# Patient Record
Sex: Female | Born: 1965 | Race: White | Hispanic: No | Smoking: Former smoker
Health system: Southern US, Community
[De-identification: ages and names within clinical notes are randomized; demographics above are authoritative.]

## PROBLEM LIST (undated history)

## (undated) DIAGNOSIS — M797 Fibromyalgia: Secondary | ICD-10-CM

## (undated) DIAGNOSIS — F4310 Post-traumatic stress disorder, unspecified: Secondary | ICD-10-CM

## (undated) DIAGNOSIS — F419 Anxiety disorder, unspecified: Secondary | ICD-10-CM

## (undated) DIAGNOSIS — F329 Major depressive disorder, single episode, unspecified: Secondary | ICD-10-CM

## (undated) DIAGNOSIS — F319 Bipolar disorder, unspecified: Secondary | ICD-10-CM

## (undated) DIAGNOSIS — K589 Irritable bowel syndrome without diarrhea: Secondary | ICD-10-CM

## (undated) DIAGNOSIS — F32A Depression, unspecified: Secondary | ICD-10-CM

## (undated) DIAGNOSIS — K219 Gastro-esophageal reflux disease without esophagitis: Secondary | ICD-10-CM

## (undated) HISTORY — PX: CHOLECYSTECTOMY: SHX55

## (undated) HISTORY — DX: Anxiety disorder, unspecified: F41.9

## (undated) HISTORY — DX: Bipolar disorder, unspecified: F31.9

---

## 2000-07-21 ENCOUNTER — Encounter: Payer: Self-pay | Admitting: Emergency Medicine

## 2000-07-21 ENCOUNTER — Emergency Department (HOSPITAL_COMMUNITY): Admission: EM | Admit: 2000-07-21 | Discharge: 2000-07-21 | Payer: Self-pay | Admitting: Emergency Medicine

## 2001-01-19 ENCOUNTER — Encounter: Admission: RE | Admit: 2001-01-19 | Discharge: 2001-02-17 | Payer: Self-pay | Admitting: Family Medicine

## 2002-10-16 ENCOUNTER — Ambulatory Visit (HOSPITAL_COMMUNITY): Admission: RE | Admit: 2002-10-16 | Discharge: 2002-10-16 | Payer: Self-pay | Admitting: Neurology

## 2002-10-16 ENCOUNTER — Encounter (INDEPENDENT_AMBULATORY_CARE_PROVIDER_SITE_OTHER): Payer: Self-pay | Admitting: *Deleted

## 2003-05-08 ENCOUNTER — Emergency Department (HOSPITAL_COMMUNITY): Admission: EM | Admit: 2003-05-08 | Discharge: 2003-05-08 | Payer: Self-pay | Admitting: *Deleted

## 2003-05-09 ENCOUNTER — Ambulatory Visit (HOSPITAL_COMMUNITY): Admission: RE | Admit: 2003-05-09 | Discharge: 2003-05-09 | Payer: Self-pay | Admitting: *Deleted

## 2003-05-09 ENCOUNTER — Encounter: Payer: Self-pay | Admitting: *Deleted

## 2003-05-14 ENCOUNTER — Encounter: Payer: Self-pay | Admitting: General Surgery

## 2003-05-14 ENCOUNTER — Ambulatory Visit (HOSPITAL_COMMUNITY): Admission: RE | Admit: 2003-05-14 | Discharge: 2003-05-14 | Payer: Self-pay | Admitting: General Surgery

## 2003-05-17 ENCOUNTER — Ambulatory Visit (HOSPITAL_COMMUNITY): Admission: RE | Admit: 2003-05-17 | Discharge: 2003-05-17 | Payer: Self-pay | Admitting: Gastroenterology

## 2003-05-20 ENCOUNTER — Encounter (INDEPENDENT_AMBULATORY_CARE_PROVIDER_SITE_OTHER): Payer: Self-pay | Admitting: *Deleted

## 2003-05-20 ENCOUNTER — Ambulatory Visit (HOSPITAL_COMMUNITY): Admission: RE | Admit: 2003-05-20 | Discharge: 2003-05-21 | Payer: Self-pay | Admitting: Surgery

## 2003-05-20 ENCOUNTER — Encounter: Payer: Self-pay | Admitting: Surgery

## 2004-07-01 ENCOUNTER — Other Ambulatory Visit: Admission: RE | Admit: 2004-07-01 | Discharge: 2004-07-01 | Payer: Self-pay | Admitting: Family Medicine

## 2004-08-13 ENCOUNTER — Encounter
Admission: RE | Admit: 2004-08-13 | Discharge: 2004-11-11 | Payer: Self-pay | Admitting: Physical Medicine and Rehabilitation

## 2004-08-17 ENCOUNTER — Ambulatory Visit: Payer: Self-pay | Admitting: Physical Medicine and Rehabilitation

## 2004-09-25 ENCOUNTER — Encounter: Admission: RE | Admit: 2004-09-25 | Discharge: 2004-09-25 | Payer: Self-pay | Admitting: Family Medicine

## 2004-10-16 ENCOUNTER — Ambulatory Visit: Payer: Self-pay | Admitting: Physical Medicine and Rehabilitation

## 2004-11-23 ENCOUNTER — Encounter
Admission: RE | Admit: 2004-11-23 | Discharge: 2005-02-21 | Payer: Self-pay | Admitting: Physical Medicine and Rehabilitation

## 2004-12-23 ENCOUNTER — Ambulatory Visit: Payer: Self-pay | Admitting: Physical Medicine and Rehabilitation

## 2004-12-29 ENCOUNTER — Ambulatory Visit: Payer: Self-pay | Admitting: Physical Medicine & Rehabilitation

## 2005-02-17 ENCOUNTER — Ambulatory Visit: Payer: Self-pay | Admitting: Physical Medicine and Rehabilitation

## 2005-03-16 ENCOUNTER — Encounter
Admission: RE | Admit: 2005-03-16 | Discharge: 2005-06-14 | Payer: Self-pay | Admitting: Physical Medicine and Rehabilitation

## 2005-04-16 ENCOUNTER — Ambulatory Visit: Payer: Self-pay | Admitting: Physical Medicine and Rehabilitation

## 2005-05-20 ENCOUNTER — Ambulatory Visit: Payer: Self-pay | Admitting: Physical Medicine and Rehabilitation

## 2005-07-23 ENCOUNTER — Other Ambulatory Visit: Admission: RE | Admit: 2005-07-23 | Discharge: 2005-07-23 | Payer: Self-pay | Admitting: Family Medicine

## 2005-11-23 ENCOUNTER — Encounter: Admission: RE | Admit: 2005-11-23 | Discharge: 2006-02-21 | Payer: Self-pay | Admitting: Family Medicine

## 2006-09-14 ENCOUNTER — Other Ambulatory Visit: Admission: RE | Admit: 2006-09-14 | Discharge: 2006-09-14 | Payer: Self-pay | Admitting: Family Medicine

## 2009-06-05 ENCOUNTER — Emergency Department (HOSPITAL_COMMUNITY): Admission: EM | Admit: 2009-06-05 | Discharge: 2009-06-05 | Payer: Self-pay | Admitting: Emergency Medicine

## 2009-07-01 ENCOUNTER — Emergency Department (HOSPITAL_COMMUNITY): Admission: EM | Admit: 2009-07-01 | Discharge: 2009-07-01 | Payer: Self-pay | Admitting: Emergency Medicine

## 2009-07-01 IMAGING — CR DG CHEST 2V
2 series · 2 of 2 positions shown · non-contrast
Comparison: None.

CLINICAL DATA: Short of breath.  Right-sided pain.

CHEST - 2 VIEW

[w chest pa]
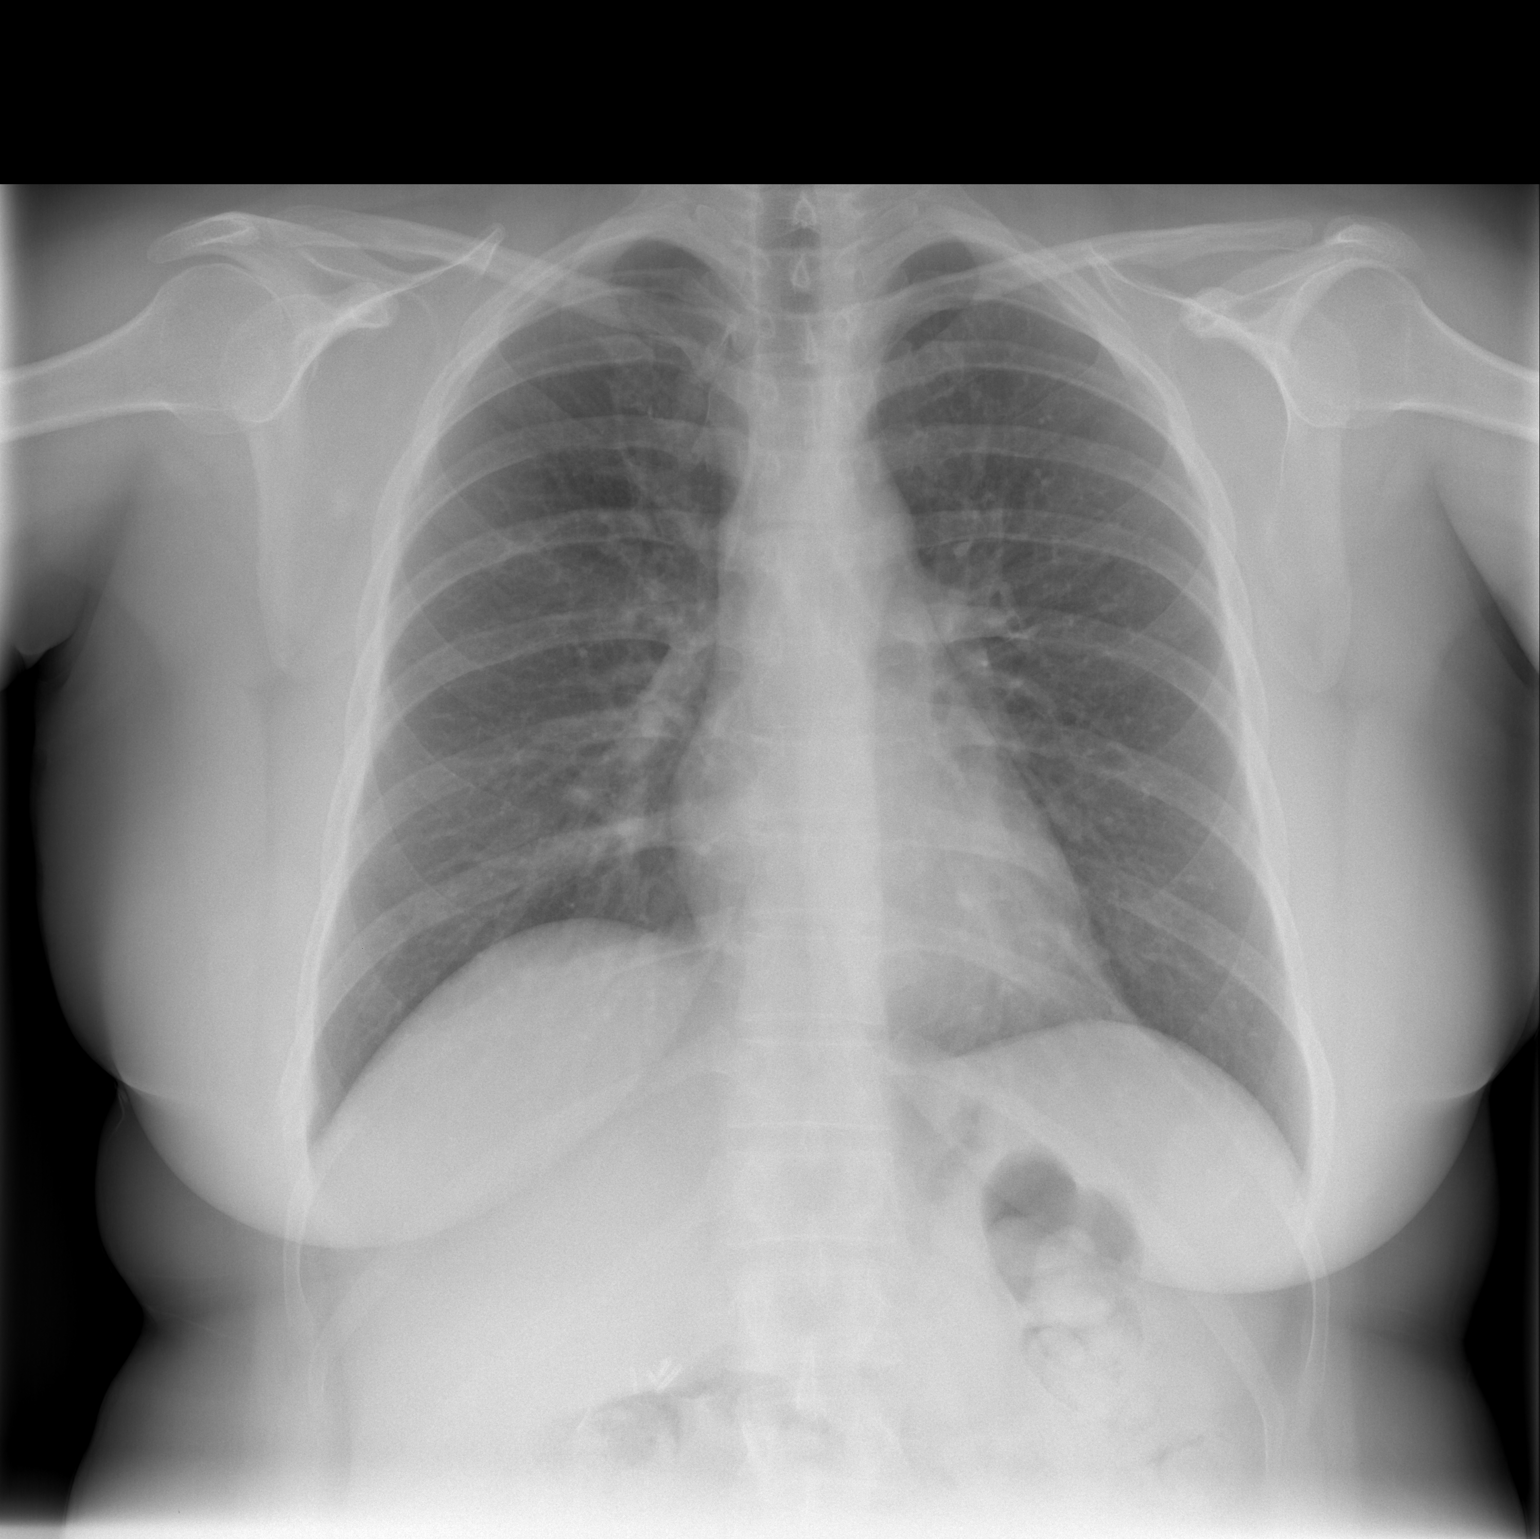

[w chest lat]
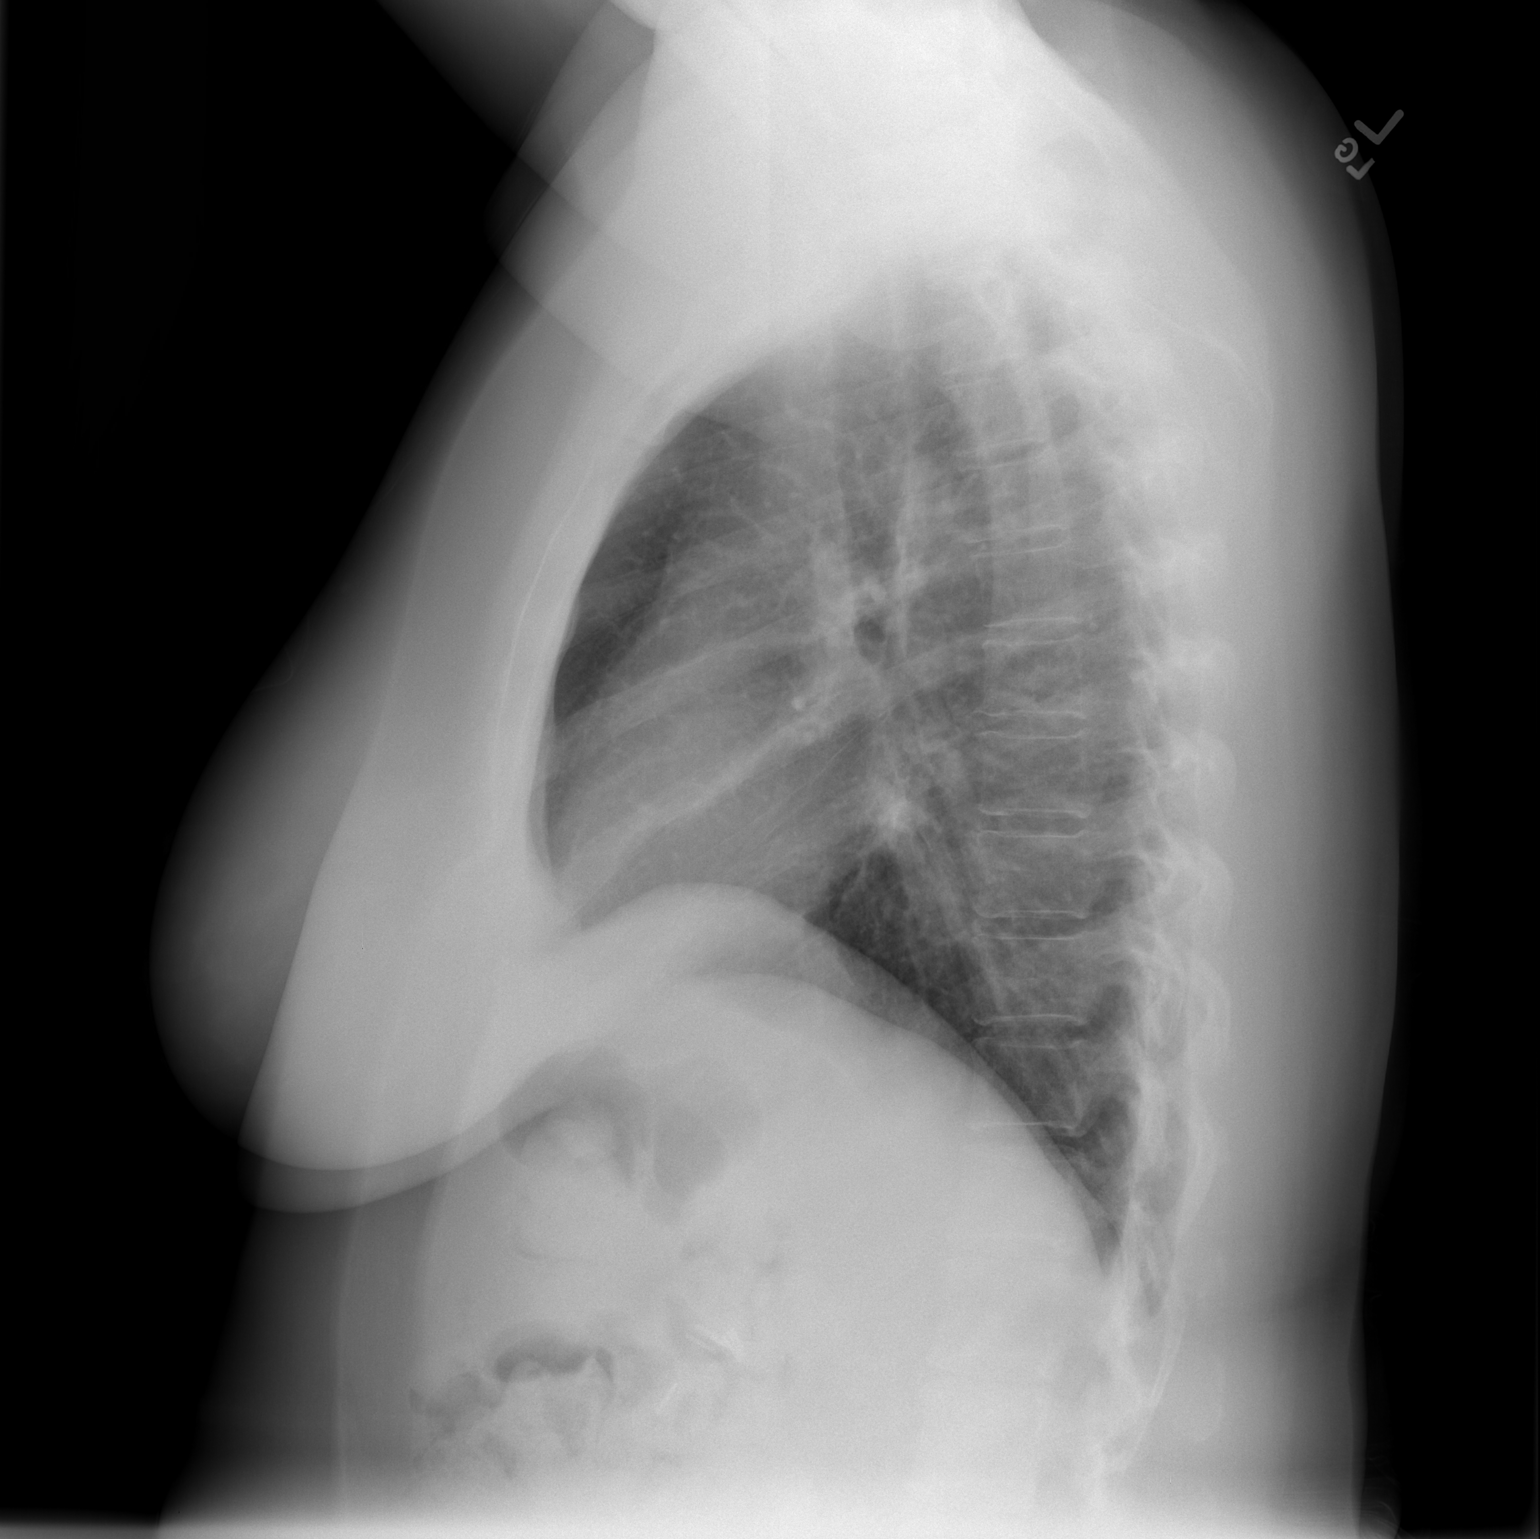

[2 of 2 positions shown; findings below may reference images not displayed]

FINDINGS: There is no airspace disease or effusion.
Cardiopericardial silhouette and mediastinal contours are normal.
Trachea is midline.  Paratracheal stripes appear within normal
limits.  Cholecystectomy clips are present. Cortical deformity is
present in the lateral right ninth rib that could potentially
represent fracture.  Clinically correlate for site of tenderness.
No pneumothorax.
IMPRESSION: No acute cardiopulmonary disease.  Possible right lateral ninth rib
fracture.

## 2010-12-05 ENCOUNTER — Encounter: Payer: Self-pay | Admitting: Physical Medicine and Rehabilitation

## 2010-12-06 ENCOUNTER — Encounter: Payer: Self-pay | Admitting: Family Medicine

## 2011-01-08 ENCOUNTER — Inpatient Hospital Stay (INDEPENDENT_AMBULATORY_CARE_PROVIDER_SITE_OTHER)
Admission: RE | Admit: 2011-01-08 | Discharge: 2011-01-08 | Disposition: A | Discharge status: Home / Self Care | Payer: Self-pay | Source: Ambulatory Visit | Attending: Family Medicine | Admitting: Family Medicine

## 2011-01-08 DIAGNOSIS — M549 Dorsalgia, unspecified: Secondary | ICD-10-CM

## 2011-02-21 LAB — POCT URINALYSIS DIP (DEVICE)
Bilirubin Urine: NEGATIVE
Hgb urine dipstick: NEGATIVE
Nitrite: NEGATIVE
Specific Gravity, Urine: 1.015 (ref 1.005–1.030)
pH: 7.5 (ref 5.0–8.0)

## 2011-02-21 LAB — CBC
HCT: 42.4 % (ref 36.0–46.0)
Hemoglobin: 15 g/dL (ref 12.0–15.0)
MCHC: 35.3 g/dL (ref 30.0–36.0)
MCV: 95.8 fL (ref 78.0–100.0)
Platelets: 291 10*3/uL (ref 150–400)
RBC: 4.43 MIL/uL (ref 3.87–5.11)
RDW: 12.1 % (ref 11.5–15.5)
WBC: 12.5 10*3/uL — ABNORMAL HIGH (ref 4.0–10.5)

## 2011-02-21 LAB — DIFFERENTIAL
Lymphocytes Relative: 26 % (ref 12–46)
Lymphs Abs: 3.2 10*3/uL (ref 0.7–4.0)
Monocytes Relative: 7 % (ref 3–12)
Neutro Abs: 8.3 10*3/uL — ABNORMAL HIGH (ref 1.7–7.7)
Neutrophils Relative %: 66 % (ref 43–77)

## 2011-02-21 LAB — COMPREHENSIVE METABOLIC PANEL
ALT: 19 U/L (ref 0–35)
BUN: 8 mg/dL (ref 6–23)
Calcium: 9.4 mg/dL (ref 8.4–10.5)
Creatinine, Ser: 0.86 mg/dL (ref 0.4–1.2)
GFR calc non Af Amer: >60 mL/min (ref >60)
Glucose, Bld: 92 mg/dL (ref 70–99)
Sodium: 138 mEq/L (ref 135–145)
Total Protein: 7.1 g/dL (ref 6.0–8.3)

## 2011-04-02 NOTE — Assessment & Plan Note (Signed)
REFERRING PHYSICIAN:  Shaune Pollack, M.D.   HISTORY:  [name] is a 45 year old white female who has a history of lower  extremity spasticity who also has complaints of mid to upper thoracic back  pain which she describes as a sharp and stabbing sensation and  intermittently tingling and aching however, fairly constant pain.  Her  average pain is about a 6 on a scale of 10.  Her sleep is overall fair.  She  gets fair relief from her medications.  Pain is worse with walking, bending,  sitting, and standing; improves with rest.   FUNCTION:  Patient uses a cane for ambulating, can walk about 30 minutes at  a time, is able to climb stairs and she does drive.  She has been disabled  since March 2005.  She requires some assistance with some household duties  and shopping.  Review of systems is reviewed on the health and history form  today.  She reports weight gain.  Denies any new changes in her past  medical, social or family history.   EXAMINATION:  Blood pressure 140/75, pulse 99, respirations 20, 100%  saturated on room air.  She is alert, oriented, cooperative.  Affect is  overall bright and alert.  She is able to stand up in the room and her gait  displayed some spasticity, especially the right lower extremity.  Seated  reflexes are overall brisk.  She has three beats of clonus on the left.  Motor strength is otherwise 5/5 at hip flexors, knee extensors,  dorsiflexors, plantar flexors, EHL.  She has somewhat ratchety manual muscle  testing, however.  She reports decreased sensation with pinprick throughout  the bilateral T5-6 dermatome.   IMPRESSION:  1.  Chronic upper thoracic back pain.  2.  Chronic neck pain.  3.  Lower extremity spasticity with three beats of clonus on the left.  4.  Patient reports diminished sensation in the T5-6 dermatome posteriorly.   PLAN:  We will have her continue to use baclofen.  She reports that it is  somewhat helpful with the spasticity and it is  somewhat sedating for her.  She does not take it when she is going to be driving.  She takes between  three and four baclofen pills a day.  She continues to use Lidoderm although  does not need a prescription today.  She has not had any further tremors in  her upper extremities.  With her decreased sensation to pinprick in the T6-7  dermatome and increased pain in that area in conjunction with the lower  extremity spasticity, I will go ahead and order thoracic MRI.  She has not  had thoracic MRI during the workup of her spasticity yet.  We will see her  back after these are completed.  Apparently she is also set up to see a  neurologist for evaluation as well.  We will see her back in 1 month.       DMK/MedQ  D:  11/25/2004 17:08:42  T:  11/25/2004 21:04:11  Job #:  [id number]

## 2011-04-02 NOTE — Op Note (Signed)
NAME:  Gwendolyn Green, Gwendolyn Green                          ACCOUNT NO.:  [id number]   MEDICAL RECORD NO.:  [id number]                   PATIENT TYPE:  AMB   LOCATION:  ENDO                                 FACILITY:  [facility]   PHYSICIAN:  Sandria Bales. Ezzard Standing, M.D.               DATE OF BIRTH:  1966/10/29   DATE OF PROCEDURE:  05/20/2003  DATE OF DISCHARGE:  05/17/2003                                 OPERATIVE REPORT   PREOPERATIVE DIAGNOSIS:  Biliary dyskinesia.   POSTOPERATIVE DIAGNOSIS:  Biliary dyskinesia.   PROCEDURE:  Laparoscopic cholecystectomy with intraoperative cholangiogram.   SURGEON:  Sandria Bales. Ezzard Standing, M.D.   FIRST ASSISTANT:  Velora Heckler, M.D.   ANESTHESIA:  General endotracheal.   ESTIMATED BLOOD LOSS:  Minimal.   INDICATIONS FOR PROCEDURE:  [name] is a 45 year old black female, patient  of Shaune Pollack, who has had epigastric and right upper quadrant abdominal  pain with nausea and some vomiting for approximately 2 weeks.  Her abdominal  pain has gotten better.  She has continued to feel nauseous.  Her symptoms  actually go back further than this, but these are more acute.  She underwent  ultrasound of her gallbladder, which was normal.  She had a hepatobiliary  scan, which showed a decreased ejection fraction with cholecystokinin of  26%.  She saw Dr. Charna Elizabeth, who did an upper endoscopy and found no  identified gastric duodenal lesion.  I discussed with the patient about  proceeding with laparoscopic cholecystectomy for biliary dyskinesia.   The indications and potential complications were explained to the patient.  The potential complications including, but not limited to, are bleeding,  infection, bowel injury, bile duct injury, open surgery, and the possibility  that this may not cure or take care of her symptoms.   She understands all of these risks today and comes to the operating room.   The patient is given a general intravenous anesthetic.  She is given 1 gm  of  Ancef for __________ .  She had PA stockings in place.  Her abdomen was  prepped with Betadine solution and sterilely draped.   Four trocars were placed in her abdomen, one at the umbilicus, with a Hasson  trocar secured with a 0 Vicryl suture and a 10 mm Ethicon trocar in the  subxiphoid location and two 5 mm trocars in the right subcostal location.   Abdominal exploration was carried out.  The right and left lobes of the  liver were unremarkable.  The anterior wall of the stomach was unremarkable.  The colon and pelvic organs were unremarkable.  I actually got a pretty good  look inside her entire abdominal cavity, so no nodularity, no mass, no  scarring, no other adhesions.  I did take pictures and placed these in the  chart.   I then turned my attention to the gallbladder.  The gallbladder was grasped  and rotated cephalad.  Dissection was carried out at the cystic  duct/gallbladder junction.  I identified the cystic artery which was triply  endoclipped.  I identified the cystic duct and placed a clip on the  gallbladder side of the cystic duct.   I then shot an intraoperative cholangiogram.  Intraoperative cholangiogram  was shot using a cut-off taut catheter inserted through a Jelco catheter  into the abdominal cavity.  I made an incision inside the cystic duct and  inserted the taut catheter and clipped this with an endo clip.  I used about  6 mL of half-strength Hypaque solution, showing free flow of contrast down  the cystic duct into the common bile duct and into the duodenum, and it  refluxed up the hepatic radical.  This was a normal intraoperative  cholangiogram.  There was no filling defect or mass.  I then removed the  taut catheter.  I triply endoclipped the cystic duct and divided the cystic  duct.  I then sharply and bluntly dissected the gallbladder from the  gallbladder bed.  There were an additional few vessels that I took with a  clip applier.   Prior to  completing the division of the gallbladder from the gallbladder  bed, I revisualized the triangle of Calot and revisualized the gallbladder  bed.  There was no bleeding or bile leak.  The gallbladder was then divided  from the liver and delivered through the umbilicus and sent to pathology.  The abdomen was then irrigated with about 500 mL of saline.  The trocar was  removed and turned.  The umbilical trocar closed with a 0 Vicryl suture.  There was no bleeding at any trocar site.  There was a Blake applied with a  Vicryl suture in the skin and closed this and then placed tincture of  Benzoin and Steri-Strips over the wounds and then sterilely dressed them.   The patient tolerated the procedure well and was transported to the recovery  room in good condition.  Sponge and needle count were correct at the end of  the operation.                                               Sandria Bales. Ezzard Standing, M.D.    DHN/MEDQ  D:  05/20/2003  T:  05/20/2003  Job:  [id number]   cc:   Duncan Dull, M.D.  [address]  [place]  [place] [address]  Fax: [phone]   Anselmo Rod, M.D.  [address]  [address]  [place]  [place] [address]  Fax: [phone]

## 2011-04-02 NOTE — Assessment & Plan Note (Signed)
MEDICAL RECORD NUMBER:  [id number].   [name] is a 45 year old single white female who is being seen in our pain  and rehabilitative clinic for multiple pain complaints. Today, she has  shaded her entire pain diagram in from the inferior half of the head through  the bilateral upper extremities, thoracic area, lumbar area, and posterior  down bilateral lower extremities to the feet. Anteriorly, she has complaints  of bilateral shoulder elbow, wrist pain, hip pain, bilateral knee pain, and  ankle pain. She describes her pain as being about an 8 to a 9 on a scale of  10. It varies between being intermittent and constant. Sharp, dull,  stabbing, and aching in its nature. Interferes with her general activity a  lot to completely. Time of day which is worse for her is morning, day time  and evening. Sleep is fair. Pain is typically worsened with activities,  walking, bending, sitting including inactivity, standing. Improves with  rest. She gets fair relief with her medications currently. She uses a cane  for mobility. She is able to climb steps and drive. She was last employed  January 30, 2004.   Admits to some weakness, tremor, trouble walking, spasms. Feels nausea is  related to high levels of pain. Denies any problems in her past medical,  social, or family history.   Medications at this time including Cymbalta 60 mg 1 p.o. q.d., Zonegran 300  mg q.h.s., baclofen 10 mg b.i.d.   PHYSICAL EXAMINATION:  She is a well-developed, mildly obese, white female  in no apparent distress. She is oriented x3. Affect is bright, alert,  cooperative, pleasant. She has a tremor in the right lower extremity as she  walks. Seated, reflexes are brisk in the upper and lower extremities with  one to two beats of clonus on the right. Motor strength is quite good  throughout. No sensory deficits reported today with light touch. No  significant tenderness to palpation in the parascapular region.   IMPRESSION:   Upper and lower extremity pain including neck, upper back, arms  and legs. May have a neuropathic component, possibly myofascial, and she  also complains of large joint involvement. Etiology not clear. She reports  she has some improvement with baclofen and Zonegran. Her dose of baclofen  was decreased to 10 mg once a day. She complained that her pain was somewhat  worse over the last month and is asking for baclofen twice a day today.   Neurologically, she appears stable. She apparently has had workup for  rheumatologic problems. She is also followed by neurologist. At this point,  I really do not have much more to offer her. We have suggested botulinum  toxin into some of more spastic lower extremity muscles to see if we could  improve her gait somewhat. Apparently, there is a problem with the insurance  covering this. Also recommended pool therapy to maintain strength and range  of motion. She has mentioned that she has problems with finances and may be  unable to afford the [facility] pool program. We will give her a prescription for  her baclofen 10 mg 1 p.o. b.i.d. with three refills. We will see her back in  three to six months.       DMK/MedQ  D:  05/21/2005 13:43:16  T:  05/21/2005 14:55:30  Job #:  [id number]

## 2011-04-02 NOTE — Assessment & Plan Note (Signed)
Gwendolyn Green is a 45 year old single white female who is being seen in our pain  and rehabilitative clinic for multiple pain complaints, including  cervicalgia, bilateral elbow pain, hand pain, lumbago, and bilateral leg  pain.  She also has a history of essential tremor, predominantly right lower  extremity and mildly in the right upper extremity.  She reports her average  pain throughout all of these areas is about 7/10.  She describes her pain as  being intermittently sharp, burning, stabbing, aching.  It is fairly  constant.  The pain is worse with walking, standing, sitting, and with  activity.  It improves somewhat with medications.  Currently, she is getting  a little relief with her medications.  She uses a cane to walk.  She is able  to walk 30-45 minutes at a time.  She is able to fine stairs but is having  some difficulty.  She currently drives.  She is disabled as of January 29, 2005.  She denies any problems with depression, anxiety, or suicidal  ideations.   REVIEW OF SYSTEMS:  Negative for everything except for weight loss.   PAST MEDICAL HISTORY:  Unchanged since last visit.   FAMILY HISTORY:  Unchanged since last visit.   SOCIAL HISTORY:  Unchanged since last visit.   PHYSICAL EXAMINATION:  VITAL SIGNS:  Blood pressure 123/78, pulse 130,  respirations 16, saturation 99% on room air.  GENERAL:  She is a well-developed, well-nourished woman in no apparent  distress.  Oriented x3.  Affect is overall bright and alert.  Appropriate.  NEUROMUSCULAR:  Gait is abnormal in that she has prominent tremor in the  right lower extremity, as she ambulates.  As she is sitting, it is noted  that she has a milder right upper extremity tremor.  She is able to flex  forward, extend back.  With her lumbar spine, she has some trouble with the  balance because the leg has a significant tremor during these activities.  She has normal cervical range of motion.  Normal shoulder range of motion.  She is quite flexible overall.  Lower extremity flexibility is also quite  good.  Motor strength is excellent throughout.  No obvious focal weaknesses  noted.  There is no numbness noted with leg test.   IMPRESSION:  1.  Myofascial parascapular cervical pain.  2.  Lower extremity spasms, most likely central in origin.  Unclear is the      etiology of the upper extremity discomfort she is experiencing in the      hands.  This is new since our last couple of visits.  May consider some      electrodiagnostic testing at some point.  She does have quite a bit of      financial issues as well as problems with insurance coverage.  In fact,      she is waiting for insurance approval to undergo the Botox injections.   Today we will refill her medication.  She felt that the Baclofen was  somewhat helpful with her pain.  We will put her back on 10 mg 1 p.o.  b.i.d., give her #60, and would do a trial of Neurontin 300 mg 1 p.o. q.h.s.  x5 days, 1 p.o. b.i.d. x5 days, and then 1 p.o. t.i.d., #90 given.  Again,  may consider electrodiagnostic studies.  Recommend dental strengthening  program.  The prescription for therapy was written out for her today.  She  might be quite well in  a pool environment.  If she tolerates the Neurontin  and it seems to be helpful with her pain, may consider increasing it at the  next visit.  May also consider adding Ultracet at the next visit as well.  We will see her back in a month.     DMK/MedQ  D:  02/17/2005 16:41:50  T:  02/17/2005 17:40:10  Job #:  [id number]

## 2011-04-02 NOTE — Assessment & Plan Note (Signed)
MEDICAL RECORD NUMBER:  [id number].   [name] is a 45 year old white female who is being seen in our pain and  rehabilitative clinic for pain in her upper back and upper extremities as  well as bilateral lower extremities. Etiology of this pain is not entirely  clear to me. She may carry a diagnosis of some fibromyalgia or parascapular  pain. She also has a tremor in her right upper extremity and predominantly  right lower extremity which may also exacerbate her pain.   She is being followed by neurology, Dr. Despina Arias at [facility]. She has an appointment with him scheduled in August  of this year.   She had been placed on Neurontin. She does not feel it has been particularly  helpful. She believes the Zonegran has been a little bit helpful for  treatment of her pain. Cymbalta is apparently somewhat successful with her  depression. Baclofen is also helpful for her in the evening for her spasms.   She reports I am sick today. They put a bug bomb in my new apartment. It  has made me nauseated and ill. She followed up with her PCP and has been  getting some Phenergan to help with her nausea. Indeed in the room today, we  had to stop her interval and her physical exam due to her nausea.   Her average pain located in the bilateral upper extremities and neck to  approximately the mid forearm area as well as bilateral lower extremities  throughout both legs. She describes it as intermittent pain, variably dull.  Average pain is about a 6 on a scale of 10, can go up to a 10. Pain is worse  with all activity. Improves with rest and medications. She gets fair relief  with her medications. Interferes quite a bit with activity overall. She  reports she is able to walk, however, about 45 minutes. She walks without  assistance occasionally with a cane. She is able to climb stairs and drive.  She is independent with all of all of her self-care including meal prep,  household duties, shopping.   REVIEW OF SYSTEMS:  Positive for tremors, tingling, spasms, dizziness. Also  nausea, diarrhea, poor appetite recently; currently being followed by PCP  for that.   No other change in past medical history other than reported in above note.  She has recently moved to a new apartment. No changes in family history  since last visit.   PHYSICAL EXAMINATION:  Blood pressure 153/85, pulse 100, respirations 18,  97% saturated on room air. Mildly obese white female who appears rather sick  today. She is oriented x3. She is cooperative and pleasant, however. She is  able to stand. She continues to have the prominent tremor in the right lower  extremity, especially with walking and weight bearing. She also has a tremor  in the right upper extremity when she holds her arm straight out, fine  tremor. Her motor strength otherwise is good throughout. Reflexes are brisk,  upper and lower extremities.   IMPRESSION:  Upper and lower extremity pain including neck and upper back  pain, etiology not entirely clear. Question central in origin as Zonegran  seems to help somewhat, possibly neuropathic in origin, possibly myofascial  component.   PLAN:  Will go ahead and refill her baclofen for at night to help with  spasms. Recommend a hydrotherapy program. She tells me she can afford it. We  will provide her some information on  the local [employer]. We will see her back in  a month. She brings in a disability form that needs to be filled out from  [facility].      DMK/MedQ  D:  04/16/2005 12:35:48  T:  04/17/2005 13:44:55  Job #:  [id number]

## 2011-04-02 NOTE — Op Note (Signed)
NAME:  Gwendolyn Green, Gwendolyn Green                          ACCOUNT NO.:  [id number]   MEDICAL RECORD NO.:  [id number]                   PATIENT TYPE:  AMB   LOCATION:  ENDO                                 FACILITY:  [facility]   PHYSICIAN:  Anselmo Rod, M.D.               DATE OF BIRTH:  Feb 04, 1966   DATE OF PROCEDURE:  05/17/2003  DATE OF DISCHARGE:                                 OPERATIVE REPORT   PROCEDURE PERFORMED:  Esophagogastroduodenoscopy.   ENDOSCOPIST:  Anselmo Rod, M.D.   INSTRUMENT USED:  Olympus video panendoscope.   INDICATION FOR PROCEDURE:  A 45 year old white female with a history of  biliary dyskinesia on a HIDA scan with an EF of 26%, undergoing an EGD to  rule out peptic ulcer disease.  The patient has ongoing right upper quadrant  epigastric pain and a history of abnormal weight loss.   PREPROCEDURE PREPARATION:  Informed consent was procured from the patient.  The patient was fasted for eight hours prior to the procedure.  Basic labs  including a TSH were drawn.   PREPROCEDURE PHYSICAL:  VITAL SIGNS:  The patient had stable vital signs.  NECK:  Supple.  CHEST:  Clear to auscultation.  S1, S2 regular.  ABDOMEN:  Soft with severe epigastric tenderness on palpation with guarding.  No rebound or rigidity, no hepatosplenomegaly.   DESCRIPTION OF PROCEDURE:  The patient was placed in the left lateral  decubitus position.  She chose not to be sedated for the procedure.  Once  the patient was adequately positioned, the Olympus video panendoscope was  advanced through the mouthpiece, over the tongue, into the esophagus, under  direct vision.  The entire esophagus appeared normal with no evidence of  ring, stricture, masses, esophagitis, or Barrett's mucosa.  The scope was  then advanced into the stomach.  There was moderate diffuse gastritis  throughout the gastric mucosa.  No ulcers, erosions, masses, or polyps were  seen.  Retroflexion in the high cardia revealed no  abnormalities.  The  duodenal bulb and the proximal small bowel distal to the bulb up to 60 cm  appeared normal.   IMPRESSION:  Normal esophagogastroduodenoscopy except for mild diffuse  gastritis.    RECOMMENDATIONS:  1. Proceed with laparoscopic cholecystectomy as recommended by Sandria Bales.     Ezzard Standing, M.D.  2. Follow up on basic labs including abnormal weight loss once the procedure     has been done.                                               Anselmo Rod, M.D.    JNM/MEDQ  D:  05/17/2003  T:  05/18/2003  Job:  [id number]   cc:   Sandria Bales. Ezzard Standing, M.D.  [address]  [address], [address]  [place]  [place] [address]  Fax: [phone]   Duncan Dull, M.D.  [address]  [place]  [place] [address]  Fax: [phone]

## 2011-04-02 NOTE — Consult Note (Signed)
DATE OF SERVICE:  December 29, 2004.   MEDICAL RECORD NUMBER:  [id number].   DATE OF BIRTH:  1966-01-06.   The patient is a 45 year old female with a history of increased right lower  extremity spasticity, who has undergo neurological evaluation, as well as  magnetic resonant imaging of her cervical spine and thoracic spine.  MRI was  a negative study dated November 30, 2004.  Thus far no definite reason for  her lower extremity spasticity has been identified.  C-spine MRI was  negative as well.  She notices her foot and leg shake with prolonged  standing.  She also has this with walking.  She has tried Baclofen 10 mg  q.i.d. for this with some relief.  Last neurologic evaluation on November 30, 2004, indicated episodic essential tremor.  Started on some Zonegran for  pain.   PAST MEDICAL HISTORY:  Significant for bilateral trochanteric bursitis.   FUNCTIONAL HISTORY:  She is independent with all of her self-care.  She  walks about 30 minutes at a time.  She does dishes, cleaning and laundry  around the house.   REVIEW OF SYSTEMS:  Depression improved after medications.  Poor sleep.  Spasms in the right leg.   PHYSICAL EXAMINATION:  Blood pressure 137/68, pulse 105, respiratory rate  18, O2 saturation 99% on room air.   The right lower extremity has mild tenderness to palpation in the  quadriceps.  There is no evidence of swelling in the quadriceps.  No  evidence of fasciculations.  She does have 3+ knee jerk on the right  compared to 2+ on the left and 1+ bilateral ankles.  No evidence of clonus  at the ankles.  She has no evidence of spasticity with passive range of  motion in bilateral lower extremities.  With standing, she does have a rapid  buckling and hyperextension of his right knee.  With keeping her foot flat,  her heel does come off with this movement and she also occasionally has hip  flexor activation, which takes her foot off of the ground.  Shifting her  weight to the left side tends to relieve these episodes.  When she walks,  she similarly has some knee hyperextension with some plantar flexion at the  ankle.  I do not see any dystonic movements on the left side or in the upper  extremities.  Her upper extremity strength is good.  She has no evidence of  increased tone with passive range of motion in the uppers either.   IMPRESSION:  Primarily right lower extremity muscle spasms.  This would not  be described as true spasticity given that it is not velocity related.  It  appears to be more of a dystonia.  Treatment-wise, I think the primary  muscle complex that is implicated in this movement pattern would be the  quadriceps.  I think it would be reasonable to give a trial of botulinum  toxin 200 units spread between the heads of the quadriceps.  I would think  that this would likely represent an underdosing.  However, given the risk  for increased quadriceps weakness, I think this would be the best way to  proceed.  I did indicate that she may need a brace, such as a KAFO should  she have excessive weakening of her quadriceps.  Would consider increasing  her Baclofen if she tolerates side effects.  Zanaflex may be a good choice  for her also given its pain relieving characteristics.  Some of the atypical  anticonvulsants can be pushed as well for dystonia-type management.   I will see the patient back for the botulinum injection.  I have noted that  this could make her walking worse, at least temporarily and she does  understand.  I also went over the risks for bleeding, bruising and  infections.  I will see her back after we get some insurance approval for  the injections.      AEK/MedQ  D:12/29/2004 15:21:39  T:12/29/2004 15:56:24  Job #:  [id number]

## 2011-04-02 NOTE — Group Therapy Note (Signed)
MEDICAL RECORD NUMBER:  [id number].   [name] is a 45 year old, single, white female who was referred by Duncan Dull, M.D., for evaluation and treatment of chronic pain which she has  experienced for quite a while now.   She has a history of an intention tremor.  She has seen approximately three  neurologists and has had brain MRI and EMG, all of which were apparently  normal.  I do not have all the notes regarding this.  She has also seen a  rheumatologist and had heavy metal screens done.   She has been treated with Ultram, Vicodin, Flexeril, Paxil, Elavil and TENS  unit in the past.  She has also had some physical therapy apparently.   She reports her main pain is located in the left side of her neck and  shoulder area.  This was a constant aching, burning-type feeling.  She also  has upper back and middle back pain, which she also describes as burning and  somewhat stabbing.  She has bilateral leg pain, however, worse on the right  than on the left, in the thighs, hips and calf area.  She has bilateral hand  stiffness as well, which she attributes possibly to her beginning knitting.   FUNCTIONAL STATUS:  The patient is independent with her activities of daily  living, including feeding, dressing, bathing, oral and facial hygiene and  meal prep.  She is able to walk about 30 minutes at a time.  She is able to  drive.  She is able to go up and down stairs, but feels unstable.   Denies any suicidal ideation.  Does admit to a history of depression.   SOCIAL HISTORY:  The patient denies alcohol use and reports that she has  stopped smoking.  Denies illicit drug use.  Denies any kind of DUI.  She  lives alone.  She last worked on January 30, 2004.  She worked as a Engineer, materials.  She is currently on disability.   FAMILY HISTORY:  Mother alive at age 35 with hypertension.  Father is age 51  with osteoarthritis.  No siblings.   ALLERGIES:  Denies any drug allergies.   REVIEW OF SYSTEMS:   Remarkable for spasms, depression, poor sleep and recent  weight gain.   PAST MEDICAL HISTORY:  1.  Depression.  2.  History of episodic tremor of unknown etiology.  3.  Hypercholesterolemia.   PAST SURGICAL HISTORY:  Cholecystectomy in July of 2004.   MEDICATIONS:  1.  Paxil 20 mg one p.o. daily.  2.  Elavil 25 mg two to three p.o. q.h.s.   PHYSICAL EXAMINATION:  GENERAL APPEARANCE:  A alert and oriented woman in no  apparent distress during our interview.  She was appropriate.  Did not  appear uncomfortable.  Cooperative.  VITAL SIGNS:  The blood pressure is 125/78, pulse 116, respirations 20 and  100% saturated on room air.  SKIN:  Unremarkable.  NECK:  Mild limitations with rotation to the right.  Otherwise full shoulder  range of motion.  EXTREMITIES:  Increased tone, especially in the lower extremities.  No edema  noted.  She had full range of motion in the hips, knees and ankles.  NEUROLOGIC:  No significant tenderness in the cervical paraspinal  musculature or parascapular musculature, just mild tenderness.  She had  motor strength in the upper extremities with 5/5 at shoulder abductors,  biceps, triceps, wrist extensors, finger flexors and intrinsics and 5/5 at  the hip flexors,  knee extensors, dorsiflexors, plantar flexors and EHL.  Significant tremor, more so on the right than on the left.  A mild tremor  was noted in the right upper extremity, more so on the left.  Reflexes were  2-3+ at the biceps, triceps and brachial radialis and 3+ at the patellar  tendon and Achilles' tendon.  Clonus was noted bilaterally.   On testing light touch and pinprick, she did not report any specific  deficits.  On Romberg's test, she had difficulty standing and appeared  somewhat unstable during Romberg's test.  Gait was quite remarkable for  quite a bit of spasticity during her gait cycle, especially when she beared  weight on the right lower extremity.  She had definite tremor with  each  step.  Her gait was mildly unstable.  She did prefer to use a cane when she  is out in the community.   IMPRESSION:  1.  Left shoulder and neck pain, as well as intrascapular pain.  2.  Bilateral lower extremity pain.  3.  Lower extremity increased tone and spasticity.   PLAN:  1.  Will obtain cervical MRI to rule out cervical disk lesion.  I do not      have any results from any previous cervical MRIs in the chart today.  2.  Will start the patient on some Baclofen 5 mg p.o. t.i.d. p.r.n., #90.  3.  Will give her some Lidoderm one to three patches 12 hours on and 12      hours off, #90.  4.  At some point will also consider possibly adding a TENS unit.      Apparently she has had a trial and it has been successful.  She will      need to get those results at the next visit.  5.  May also consider a switch from her Paxil to either Effexor or Cymbalta.      Either of them are beneficial for pain management, as well as depression      combined.  6.  Will end a copy of this note to her family doctor, Dr. Kevan Ny.  7.  Will see her back in one month and obtain results of her MRI as well.      DMK/MedQ  D:  08/17/2004 12:24:07  T:  08/17/2004 17:42:22  Job #:  [id number]   cc:   Duncan Dull, M.D.  [address]  [place]  [place] [address]  Fax: [phone]

## 2011-04-02 NOTE — Assessment & Plan Note (Signed)
INTERVAL HISTORY:  [name] is a 45 year old single white female who is a  patient of Dr. Kevan Ny.   She has been seen in our pain and rehabilitative clinic for cervicalgia and  parascapular pain as well as some low back pain, bilateral hip pain and  right knee pain.  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   She has been seen in clinic briefly by Dr. Wynn Banker who is considering  Botox injection to the quadriceps.   She has also been followed by a neurologist Dr. Epimenio Foot who has recently  placed her on Zonegran.   She is in today reports some overall improvements of her pain, her average  pain is about a 5 on a scale of 10, pain is described as intermittent,  aching and sharp, it moderately interferes with her general activity.  She  has been able to be fairly active.  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   Her pain is typically worse with walking, sitting and some activities such  as prolonged use of her computer, improves with rest and medication,  currently she is getting fair relief from her meds.  Her sleep is overall  fair.  The timing of the day which pain is worse is typically in the  evening.   Again, she can walk about 30-40 minutes at a time.  She reports some  weakness, tremors, tingling, trouble walking and spasms in her lower  extremities.   Otherwise, no new changes in her past medical, social or family history.   EXAMINATION:  Blood pressure is 133/77, pulse 92, respirations 16, 98%  saturated on room air.  She is able to independently stand from a seated  position.  Her gait is unchanged from last visit. Today palpate over her  parascapular region and cervical region, she had tenderness along these  areas with palpation.  She has very good range of motion as far as  flexibility in her neck and shoulder regions, no new weakness is noted.   IMPRESSION:  1.  Myofascial parascapular and cervical pain.  2.  Lower extremity spasms most likely central origin.   PLAN:  For the myofascial pain recommend one or two therapy sessions for  education on proper body mechanics and an upper scapular strengthening  program and pectoralis stretching program.  Upon questioning she really is  not doing any strengthening exercises but continues to stretch out her  scapular areas without any pectoralis stretching.   Encourage her to continue her home program.  She is up to 2 minutes on the  elliptical trainer and 6 minutes on her bike.  She tells me she has lost  over 10 pounds since January.  Would recommend Lidoderm and TENS unit as  well as physical therapy however, apparently there has been some problems  with insurance covering Lidoderm and TENS unit for her.  She brings in today  from [facility] a form regarding her eligibility for long term  disability benefits.  I fill this out while she is present.  We will have  the office attach notes and get this sent back for her.  We  will see her back in a month.  No medications were prescribed today.  She  seems to be doing quite well on baclofen once  or twice a day, Zonegran 100  mg one to two at night as well as Cymbalta 60 once daily.      DMK/MedQ  D:  01/20/2005 12:22:12  T:  01/20/2005 13:59:26  Job #:  [id number]

## 2011-04-02 NOTE — Assessment & Plan Note (Signed)
MEDICAL RECORD NUMBER:  [id number].   DATE OF BIRTH:  [birth year].   REFERRING PHYSICIAN:  Duncan Dull, M.D.   Ms. [name] is a 45 year old white female who was referred by Duncan Dull,  M.D., for evaluation and treatment of chronic pain for which she has a long  history of.   At last visit, she was noted to have upper and lower extremity spasticity  and tremors.   She underwent a cervical MRI, which apparently was normal.  Our study was  from [facility] and done without contrast on August 29, 2004, ready by  Frederica Kuster, M.D.  No significant findings.  No evidence of disk  protrusion, extrusion or focal canal or foraminal impingement.   Results were reviewed with her today.   She has been taking Baclofen 5 mg twice a day.  She believes it seems to  help somewhat with her spasticity, however, it is somewhat sedating for her.  Instead of taking it three times a day, she is taking it mainly just at  night.   She reports pain today in multiple places, including her neck just under her  right scapula, bilateral low back pain, knee pain and hand pain, as well as  ankle pain.   She describes her pain average is about an 8 on a scale of 10.  It goes up  to a 10 and can go down to a 5.   She has seen Dr. Vickey Huger, Dr. Misty Stanley and Dr. Dan Humphreys.  Dr. Misty Stanley is at  [facility].  Dr. Dan Humphreys is over at [facility].  These are all neurologists who  have evaluated her in the past.  I would like to obtain some notes regarding  their previous evaluations.  She will see if she can get these for me.   The patient denies any problems with bowel or bladder function.  Denies any  new weakness.  Reports some recent perioral, as well as bilateral hand and  foot tingling.   The patient is independent with ADLs, including feeding, dressing, bathing,  oral and facial hygiene and meal preparation.  She is able to walk about 30  minutes at a time.  She is independent with domestic duties, such  as dishes,  cleaning and laundry.   She is currently on disability.   She is able to drive.  She lives alone.   Reports that overall sleep has been poor.   Denied harm to self or others.   Denies any changes in her medical history.   PHYSICAL EXAMINATION:  On exam, she is a well-developed, well-nourished  woman in no apparent distress.  The blood pressure is 127/67, pulse 112,  respirations 20 and 100% saturated on room air.  She is able to come out of  the chair.  Her gait in the room is notable for spasticity in the lower  extremities as she weightbears on each foot, especially noted a little more  on the right than on the left today.  Romberg's test is negative.  She is  able to stand with her eyes closed without falling.  She does have a mild  tremor in her upper extremities.  Seated reflexes are 2+ at biceps and  triceps, one at the brachial radialis bilaterally, 3 at the knees and 3 at  the ankles bilaterally.  Toes are equivocal.  She has several beats of  clonus bilaterally.   Motor strength is otherwise good.  She has increased tone in her  upper  extremities with doing manual muscle testing, especially at the deltoid  area.  Otherwise throughout the upper extremities, as well as the lower  extremities, she has 5/5 strength.  She reports no sensory deficits with  light touch today.   IMPRESSION:  1.  Chronic neck and shoulder pain.  2.  Chronic low back pain.  3.  Upper and lower extremity spasticity.  4.  Bilateral trochanteric bursitis.  This area was examined as well.  She      has tenderness to palpation over bilateral trochanters.   PLAN:  Will have her obtain previous workups from Drs. Dohmeier, Misty Stanley and  Environmental consultant for me to review.  The patient reports she does use the Lidoderm at  night, but she does not need refill on this.  Will refill her Baclofen 10 mg  one-half to one tablet p.o. t.i.d., #90, for her spasms.  Was considering  thoracic MRI, however, with  her upper extremity spasticity I doubt we will  pursue this.  Would rather review prior workups from the above neurologists.  Will also consider switching her from Paxil to Effexor or Cymbalta.  Will  see her back in one month.  Will also recommend a TENS unit and will try to  get that set up for her as well.  Will see her back in one month.       DMK/MedQ  D:  09/16/2004 12:27:32  T:  09/16/2004 14:26:24  Job #:  [id number]   cc:   Duncan Dull, M.D.  [address]  [place]  [place] [address]  Fax: [phone]

## 2011-04-02 NOTE — Assessment & Plan Note (Signed)
Gwendolyn Green is a 45 year old white female who is a patient of referring  physician, Dr. Shaune Pollack.  She is being seen in our pain and  rehabilitative clinic for chronic neck, back, low back, and leg pain.  She  also has an essential tremor which gives especially her right lower  extremity spasticity making it difficult for her to stand and walk.   She has been undergoing recent workup by Dr. Epimenio Foot over at [facility].  Apparently it is felt to be an essential tremor.  She has recently been tried on Zonegran and Cymbalta.  She just recently  started this.  We have trialed her on baclofen as well and she reports that  tremor is essentially no better with the baclofen, but overall pain scores  are down from an 8 or 9 down to a 5.  She reports her pain mainly being  located in various places, especially throughout her neck, low back, and  legs and she describes it as sharp, stabbing, tingling, and aching.  Sleep  has overall been poor lately.  She is also concerned about weight gain.  The  pain is worsened by walking, bending, and standing.  Improves with rest.  She gets a little relief with her medications at this time.   She will often walk with a cane.  She can walk about 10 to 15 minutes at a  time.  She is able to go up and down stairs and she is currently driving.  She was disabled January 28, 2004.  She has some difficulty with hospital  duties, but is independent with all of her self-care.  Denies any suicidal  ideations.  Denies problems controlling bowel or bladder.   No changes in past medical, social, family history since last visit.   PHYSICAL EXAMINATION:  VITAL SIGNS:  Blood pressure 129/76, pulse 90,  respirations 16, 97% saturated on room air.  GENERAL:  She is alert, mildly obese, cooperative individual.  PSYCHIATRIC:  She is oriented to person, place, and date.  Affect is overall  bright.  MUSCULOSKELETAL:  She is able to stand from a seated  position independently.  Gait is somewhat interrupted by lower extremity, especially right lower  extremity, tremors.  Standing as she weightbears through the right lower  extremity.  She sustains more tremors.  Reflexes are brisk overall.  Motor  strength is actually quite good.  Denies any numbness, tremors are much  better at rest.  She has multiple trigger points, a total of 10 tender  points, not so much in the posterior scapular area, although she often does  have these, more so on the medial elbow and medial knee,  sternocleidomastoids bilaterally of the lower lumbar area.   IMPRESSION:  1.  Chronic neck, shoulder, and chronic low back pain.  2.  Essential tremor of the right lower extremity most involved.   PLAN:  We will have the patient follow up with Dr. Vernell Barrier for spasticity  evaluation, tremor evaluation, to see if there is any kind of nerve block  that may be of benefit to her.  I am not sure that we will continue her on  the baclofen.  She feels it is helping with her pain, but is of little help  to her tremors.  Recently being started on Zonegran that might be a better  option for her anyway.  We will also get her involved in a conditioning  program.  Due to funding she really  can only afford a couple of visits a  month.  We will have them address some general strengthening and  conditioning with her.  She has access to a gym in her complex where she  lives.  We will refill her baclofen 10 mg one p.o. q.i.d. #120.  We will  reassess this next visit and may discontinue.  We will attempt to get some  notes from Dr. Epimenio Foot over at [facility].    DMK/MedQ  D:  12/25/2004 10:24:53  T:  12/25/2004 11:06:11  Job #:  [id number]

## 2011-04-02 NOTE — Op Note (Signed)
   NAME:  EVAN, OSBURN                          ACCOUNT NO.:  [id number]   MEDICAL RECORD NO.:  [id number]                   PATIENT TYPE:  OUT   LOCATION:  [facility]                                  FACILITY:  [facility]   PHYSICIAN:  Melvyn Novas, M.D.               DATE OF BIRTH:  December 12, 1965   DATE OF PROCEDURE:  10/16/2002  DATE OF DISCHARGE:                                 OPERATIVE REPORT   PROCEDURE PERFORMED:  Lumbar puncture.   DESCRIPTION OF PROCEDURE:  The patient underwent a bedside lumbar puncture.  She was draped in sterile paper wrap and her lumbar region was cleaned with  Betadine for access after the L3-L2 interspace was marked.  Then the patient  was topically anesthetized with lidocaine 1% and then subdermal injection  and also with a trans muscular injection and an 18 gauge needle was used for  spinal access and the spinal fluid was easily obtained without resistance or  bleed.  Four vials of 3.5 cc each were taken.  The fluid appeared clear,  untinged, without any color.  The needle was retracted.  The patient  received a band-aid and the lumbar area was cleaned from the Betadine.  She  was asked to stay 19 minutes in a reclined position and will be discharged  home after this.  She was encouraged to take oral fluid and Tylenol for  headache should those arise. Tube #1 sent for protein, glucose and  oligoclonal bands.  Tube #2 was sent for cell count and differential with  cytology.  Tube #3 was sent for fungal culture and smear bacterial culture  and smear cryptococcal antigen. The #4 will be frozen for future reference  or additional tests should questions arise.  A peripheral blood smear was  also ordered to rule out acanthocytosis.                                               Melvyn Novas, M.D.    CD/MEDQ  D:  10/16/2002  T:  10/16/2002  Job:  [id number]   cc:   Dario Guardian, M.D.  [address]  [place]  [place] [address]  Fax: [phone]   [facility]

## 2011-10-24 ENCOUNTER — Emergency Department (HOSPITAL_COMMUNITY)
Admission: EM | Admit: 2011-10-24 | Discharge: 2011-10-25 | Disposition: A | Discharge status: Home / Self Care | Payer: Medicare Other | Attending: Emergency Medicine | Admitting: Emergency Medicine

## 2011-10-24 DIAGNOSIS — R6883 Chills (without fever): Secondary | ICD-10-CM | POA: Insufficient documentation

## 2011-10-24 DIAGNOSIS — Z79899 Other long term (current) drug therapy: Secondary | ICD-10-CM | POA: Insufficient documentation

## 2011-10-24 DIAGNOSIS — R142 Eructation: Secondary | ICD-10-CM | POA: Insufficient documentation

## 2011-10-24 DIAGNOSIS — R062 Wheezing: Secondary | ICD-10-CM | POA: Insufficient documentation

## 2011-10-24 DIAGNOSIS — R141 Gas pain: Secondary | ICD-10-CM | POA: Insufficient documentation

## 2011-10-24 DIAGNOSIS — R0602 Shortness of breath: Secondary | ICD-10-CM | POA: Insufficient documentation

## 2011-10-24 DIAGNOSIS — R143 Flatulence: Secondary | ICD-10-CM | POA: Insufficient documentation

## 2011-10-24 DIAGNOSIS — J40 Bronchitis, not specified as acute or chronic: Secondary | ICD-10-CM | POA: Insufficient documentation

## 2011-10-24 DIAGNOSIS — F341 Dysthymic disorder: Secondary | ICD-10-CM | POA: Insufficient documentation

## 2011-10-24 HISTORY — DX: Depression, unspecified: F32.A

## 2011-10-24 HISTORY — DX: Major depressive disorder, single episode, unspecified: F32.9

## 2011-10-24 HISTORY — DX: Fibromyalgia: M79.7

## 2011-10-25 ENCOUNTER — Encounter: Payer: Self-pay | Admitting: *Deleted

## 2011-10-25 ENCOUNTER — Other Ambulatory Visit: Payer: Self-pay

## 2011-10-25 ENCOUNTER — Emergency Department (HOSPITAL_COMMUNITY): Payer: Medicare Other

## 2011-10-25 LAB — D-DIMER, QUANTITATIVE: D-Dimer, Quant: 0.22 ug/mL-FEU (ref 0.00–0.48)

## 2011-10-25 LAB — DIFFERENTIAL
Eosinophils Absolute: 0.3 10*3/uL (ref 0.0–0.7)
Eosinophils Relative: 2 % (ref 0–5)
Lymphocytes Relative: 27 % (ref 12–46)
Lymphs Abs: 4.5 10*3/uL — ABNORMAL HIGH (ref 0.7–4.0)
Monocytes Relative: 7 % (ref 3–12)

## 2011-10-25 LAB — CBC
HCT: 40.6 % (ref 36.0–46.0)
Hemoglobin: 14.6 g/dL (ref 12.0–15.0)
MCH: 32.7 pg (ref 26.0–34.0)
MCV: 91 fL (ref 78.0–100.0)
RBC: 4.46 MIL/uL (ref 3.87–5.11)
WBC: 16.8 10*3/uL — ABNORMAL HIGH (ref 4.0–10.5)

## 2011-10-25 LAB — POCT I-STAT TROPONIN I: Troponin i, poc: 0 ng/mL (ref 0.00–0.08)

## 2011-10-25 MED ORDER — ALBUTEROL SULFATE HFA 108 (90 BASE) MCG/ACT IN AERS: 1.0000 | INHALATION_SPRAY | Freq: Four times a day (QID) | RESPIRATORY_TRACT | Status: DC | PRN

## 2011-10-25 MED ORDER — ALBUTEROL SULFATE (5 MG/ML) 0.5% IN NEBU
5.0000 mg | INHALATION_SOLUTION | Freq: Once | RESPIRATORY_TRACT | Status: AC
  Administered 2011-10-25: 5 mg via RESPIRATORY_TRACT
  Filled 2011-10-25: qty 1

## 2011-10-25 MED ORDER — DOXYCYCLINE HYCLATE 100 MG PO CAPS: 100.0000 mg | ORAL_CAPSULE | Freq: Two times a day (BID) | ORAL | Status: AC

## 2011-10-25 NOTE — ED Provider Notes (Signed)
History     CSN: [id number] Arrival date & time: 10/24/2011 11:59 PM   First MD Initiated Contact with Patient 10/25/11 0025      Chief Complaint  Patient presents with  . Chills  . Shortness of Breath    (Consider location/radiation/quality/duration/timing/severity/associated sxs/prior treatment) Patient is a 45 y.o. female presenting with shortness of breath. The history is provided by the patient. No language interpreter was used.  Shortness of Breath  The current episode started more than 1 week ago. The onset was gradual. The problem occurs occasionally. Progression since onset: Rapidly worsened following starting seroquel then improved as she tapered off but seemed worse today. The problem is severe. The symptoms are relieved by nothing. The symptoms are aggravated by nothing. Associated symptoms include shortness of breath and wheezing. Pertinent negatives include no chest pain, no chest pressure, no orthopnea, no fever, no rhinorrhea, no sore throat and no stridor. Associated symptoms comments: chills. There was no intake of a foreign body. She was not exposed to toxic fumes. She has not inhaled smoke recently. She has had no prior steroid use. She has had no prior hospitalizations. She has had no prior ICU admissions. She has had no prior intubations. Her past medical history does not include eczema. She has been behaving normally. Urine output has been normal. The last void occurred less than 6 hours ago. There were no sick contacts. Recently, medical care has been given by the PCP. Services received include medications given (seroquel).    Past Medical History  Diagnosis Date  . Depressed   . Fibromyalgia     History reviewed. No pertinent past surgical history.  History reviewed. No pertinent family history.  History  Substance Use Topics  . Smoking status: Former Games developer  . Smokeless tobacco: Not on file  . Alcohol Use: Yes    OB History    Grav Para Term Preterm  Abortions TAB SAB Ect Mult Living                  Review of Systems  Constitutional: Positive for chills. Negative for fever and activity change.  HENT: Negative for congestion, sore throat, facial swelling, rhinorrhea, neck pain and neck stiffness.   Eyes: Negative for discharge.  Respiratory: Positive for shortness of breath and wheezing. Negative for stridor.   Cardiovascular: Negative for chest pain and orthopnea.  Gastrointestinal: Positive for abdominal distention.  Genitourinary: Negative for difficulty urinating.  Musculoskeletal: Negative for arthralgias.  Skin: Negative.   Neurological: Negative for dizziness.  Hematological: Negative.   Psychiatric/Behavioral: Negative.     Allergies  Review of patient's allergies indicates no known allergies.  Home Medications   Current Outpatient Rx  Name Route Sig Dispense Refill  . ACETAMINOPHEN 500 MG PO TABS Oral Take 1,000 mg by mouth every 6 (six) hours as needed. For pain     . DIPHENHYDRAMINE HCL 25 MG PO TABS Oral Take 50 mg by mouth every 6 (six) hours as needed. For sleep     . OMEGA-3 FATTY ACIDS 1000 MG PO CAPS Oral Take 2 g by mouth daily.      Marland Kitchen GABAPENTIN 300 MG PO CAPS Oral Take 600 mg by mouth 3 (three) times daily.      . ATIVAN PO Oral Take 1 tablet by mouth 2 (two) times daily. Unknown dose     . PRESCRIPTION MEDICATION Oral Take by mouth 2 (two) times daily. Cholesterol medication     . QUETIAPINE FUMARATE PO Oral Take  1 tablet by mouth daily. Unknown dose     . ZOLPIDEM TARTRATE 5 MG PO TABS Oral Take 2.5 mg by mouth at bedtime as needed.        BP 137/87  Pulse 120  Temp 97.7 F (36.5 C)  Resp 20  SpO2 100%  Physical Exam  Constitutional: She is oriented to person, place, and time. She appears well-developed and well-nourished.  HENT:  Head: Normocephalic and atraumatic.  Mouth/Throat: Oropharynx is clear and moist.  Eyes: EOM are normal. Pupils are equal, round, and reactive to light.  Neck:  Normal range of motion. Neck supple. No tracheal deviation present.  Cardiovascular: Normal rate and regular rhythm.  Exam reveals no friction rub.   Pulmonary/Chest: No stridor. She has wheezes. She exhibits no tenderness.  Abdominal: Soft. Bowel sounds are normal. There is no tenderness. There is no rebound and no guarding.  Musculoskeletal: Normal range of motion. She exhibits no edema.  Neurological: She is alert and oriented to person, place, and time.  Skin: Skin is warm and dry.  Psychiatric:       anxious    ED Course  Procedures (including critical care time)  Labs Reviewed  CBC - Abnormal; Notable for the following:    WBC 16.8 (*)    All other components within normal limits  DIFFERENTIAL - Abnormal; Notable for the following:    Neutro Abs 10.8 (*)    Lymphs Abs 4.5 (*)    Monocytes Absolute 1.2 (*)    All other components within normal limits  POCT I-STAT TROPONIN I  POCT PREGNANCY, URINE  D-DIMER, QUANTITATIVE  I-STAT, CHEM 8  I-STAT TROPONIN I  POCT PREGNANCY, URINE   Dg Chest 2 View  10/25/2011  *RADIOLOGY REPORT*  Clinical Data: Cough and short of breath  CHEST - 2 VIEW  Comparison: 07/01/2009  Findings: The heart size and pulmonary vascularity are normal. The lungs appear clear and expanded without focal air space disease or consolidation. No blunting of the costophrenic angles.  No pneumothorax. Healing right rib fracture is stable.  Surgical clips in the right upper quadrant.  No significant change since previous study.  IMPRESSION: No evidence of active pulmonary disease.  Original Report Authenticated By: Marlon Pel, M.D.     No diagnosis found.    MDM   Date: 10/25/2011  Rate: 106  Rhythm: sinus tachycardia  QRS Axis: right  Intervals: normal  ST/T Wave abnormalities: normal  Conduction Disutrbances: none  Narrative Interpretation:   Old EKG Reviewed: none available    Patient informed of labs and xray results.  States she feels 100%  better follow neb treatment.  Advised to take all medications and to follow up with PMD this week.  Return for Chest pain shortness of breath inability to tolerate medications worsening wheezing or any concerns.  Patient verbalizes understanding and agrees to follow up     Jemila Camille Smitty Cords, MD 10/25/11 1610

## 2011-10-25 NOTE — ED Notes (Signed)
Patient c/o being hot then cold with shortness of breath.  Patient states that she could not go to sleep because she felt short of breath.

## 2011-10-25 NOTE — ED Notes (Signed)
rx x 2, pt voiced understanding to f/u with PCP and return for worsening of condition.

## 2011-11-18 ENCOUNTER — Emergency Department (HOSPITAL_COMMUNITY)
Admission: EM | Admit: 2011-11-18 | Discharge: 2011-11-18 | Disposition: A | Discharge status: Home / Self Care | Payer: Medicare Other | Source: Home / Self Care | Attending: Family Medicine | Admitting: Family Medicine

## 2011-11-18 ENCOUNTER — Encounter (HOSPITAL_COMMUNITY): Payer: Self-pay | Admitting: Emergency Medicine

## 2011-11-18 DIAGNOSIS — K589 Irritable bowel syndrome without diarrhea: Secondary | ICD-10-CM

## 2011-11-18 MED ORDER — PANTOPRAZOLE SODIUM 20 MG PO TBEC: 40.0000 mg | DELAYED_RELEASE_TABLET | Freq: Every day | ORAL | Status: DC

## 2011-11-18 MED ORDER — ONDANSETRON HCL 4 MG PO TABS: 4.0000 mg | ORAL_TABLET | Freq: Four times a day (QID) | ORAL | Status: AC

## 2011-11-18 NOTE — ED Provider Notes (Signed)
History     CSN: [id number]  Arrival date & time 11/18/11  1416   First MD Initiated Contact with Patient 11/18/11 1600      Chief Complaint  Patient presents with  . Nausea    (Consider location/radiation/quality/duration/timing/severity/associated sxs/prior treatment) Patient is a 46 y.o. female presenting with abdominal pain. The history is provided by the patient.  Abdominal Pain The primary symptoms of the illness include abdominal pain, nausea and vomiting. Episode onset: 1 mo. ago. The onset of the illness was gradual. The problem has not changed since onset. The patient states that she believes she is currently not pregnant. The patient has had a change in bowel habit. Additional symptoms associated with the illness include anorexia. Associated symptoms comments: 7lb wt loss, loose bm activity., no blood.. Associated medical issues comments: fibromyalgia and unknown neurological disorder..    Past Medical History  Diagnosis Date  . Depressed   . Fibromyalgia     Past Surgical History  Procedure Date  . Cholecystectomy     No family history on file.  History  Substance Use Topics  . Smoking status: Former Games developer  . Smokeless tobacco: Not on file  . Alcohol Use: Yes    OB History    Grav Para Term Preterm Abortions TAB SAB Ect Mult Living                  Review of Systems  Constitutional: Positive for appetite change and unexpected weight change.  HENT: Negative.   Respiratory: Negative.   Gastrointestinal: Positive for nausea, vomiting, abdominal pain and anorexia. Negative for blood in stool and abdominal distention.  Genitourinary: Negative.   Psychiatric/Behavioral: Negative.     Allergies  Review of patient's allergies indicates no known allergies.  Home Medications   Current Outpatient Rx  Name Route Sig Dispense Refill  . ACETAMINOPHEN 500 MG PO TABS Oral Take 1,000 mg by mouth every 6 (six) hours as needed. For pain     . ALBUTEROL SULFATE  HFA 108 (90 BASE) MCG/ACT IN AERS Inhalation Inhale 1-2 puffs into the lungs every 6 (six) hours as needed for wheezing. 1 Inhaler 0  . DIPHENHYDRAMINE HCL 25 MG PO TABS Oral Take 50 mg by mouth every 6 (six) hours as needed. For sleep     . OMEGA-3 FATTY ACIDS 1000 MG PO CAPS Oral Take 2 g by mouth daily.      Marland Kitchen GABAPENTIN 300 MG PO CAPS Oral Take 600 mg by mouth 3 (three) times daily.      . ATIVAN PO Oral Take 1 tablet by mouth 2 (two) times daily. Unknown dose     . ONDANSETRON HCL 4 MG PO TABS Oral Take 1 tablet (4 mg total) by mouth every 6 (six) hours. 8 tablet 0  . PANTOPRAZOLE SODIUM 20 MG PO TBEC Oral Take 2 tablets (40 mg total) by mouth daily. 60 tablet 1  . PRESCRIPTION MEDICATION Oral Take by mouth 2 (two) times daily. Cholesterol medication     . QUETIAPINE FUMARATE PO Oral Take 1 tablet by mouth daily. Unknown dose     . ZOLPIDEM TARTRATE 5 MG PO TABS Oral Take 2.5 mg by mouth at bedtime as needed.        BP 131/75  Pulse 101  Temp(Src) 98.3 F (36.8 C) (Oral)  Resp 16  SpO2 100%  LMP 11/04/2011  Physical Exam  Nursing note and vitals reviewed. Constitutional: She is oriented to person, place, and time. She appears  well-developed and well-nourished.  HENT:  Head: Normocephalic.  Cardiovascular: Normal rate, normal heart sounds and intact distal pulses.   Abdominal: Soft. Bowel sounds are normal. She exhibits no distension and no mass. There is tenderness. There is no rebound and no guarding.  Neurological: She is alert and oriented to person, place, and time.  Skin: Skin is warm and dry.  Psychiatric: She has a normal mood and affect. Thought content normal.    ED Course  Procedures (including critical care time)  Labs Reviewed - No data to display No results found.   1. Irritable bowel syndrome (IBS)       MDM          Barkley Bruns, MD 11/19/11 1141

## 2011-11-18 NOTE — ED Notes (Signed)
Nausea, some vomiting, frequent soft stools.  Onset the beginning of December.  Using dramamine with some relief

## 2011-11-23 ENCOUNTER — Inpatient Hospital Stay (HOSPITAL_COMMUNITY)
Admission: EM | Admit: 2011-11-23 | Discharge: 2011-11-25 | DRG: 392 | Disposition: A | Discharge status: Home / Self Care | Payer: Medicare Other | Attending: Internal Medicine | Admitting: Internal Medicine

## 2011-11-23 ENCOUNTER — Encounter (HOSPITAL_COMMUNITY): Payer: Self-pay | Admitting: Emergency Medicine

## 2011-11-23 DIAGNOSIS — F3289 Other specified depressive episodes: Secondary | ICD-10-CM | POA: Diagnosis present

## 2011-11-23 DIAGNOSIS — M549 Dorsalgia, unspecified: Secondary | ICD-10-CM | POA: Diagnosis present

## 2011-11-23 DIAGNOSIS — Z79899 Other long term (current) drug therapy: Secondary | ICD-10-CM

## 2011-11-23 DIAGNOSIS — R112 Nausea with vomiting, unspecified: Principal | ICD-10-CM | POA: Diagnosis present

## 2011-11-23 DIAGNOSIS — R51 Headache: Secondary | ICD-10-CM | POA: Diagnosis present

## 2011-11-23 DIAGNOSIS — F329 Major depressive disorder, single episode, unspecified: Secondary | ICD-10-CM

## 2011-11-23 DIAGNOSIS — D72829 Elevated white blood cell count, unspecified: Secondary | ICD-10-CM | POA: Diagnosis present

## 2011-11-23 DIAGNOSIS — R519 Headache, unspecified: Secondary | ICD-10-CM

## 2011-11-23 DIAGNOSIS — R1115 Cyclical vomiting syndrome unrelated to migraine: Secondary | ICD-10-CM

## 2011-11-23 DIAGNOSIS — F419 Anxiety disorder, unspecified: Secondary | ICD-10-CM

## 2011-11-23 DIAGNOSIS — R197 Diarrhea, unspecified: Secondary | ICD-10-CM | POA: Diagnosis present

## 2011-11-23 DIAGNOSIS — R000 Tachycardia, unspecified: Secondary | ICD-10-CM | POA: Diagnosis present

## 2011-11-23 DIAGNOSIS — E785 Hyperlipidemia, unspecified: Secondary | ICD-10-CM

## 2011-11-23 DIAGNOSIS — G4700 Insomnia, unspecified: Secondary | ICD-10-CM | POA: Diagnosis present

## 2011-11-23 DIAGNOSIS — IMO0001 Reserved for inherently not codable concepts without codable children: Secondary | ICD-10-CM | POA: Diagnosis present

## 2011-11-23 DIAGNOSIS — F411 Generalized anxiety disorder: Secondary | ICD-10-CM | POA: Diagnosis present

## 2011-11-23 DIAGNOSIS — M797 Fibromyalgia: Secondary | ICD-10-CM

## 2011-11-23 NOTE — ED Notes (Signed)
PT. REPORTS PERSISTENT VOMITTING WITH DIARRHEA FOR 1 MONTH WORSE THIS EVENING WITH HEADACHE.

## 2011-11-24 ENCOUNTER — Encounter (HOSPITAL_COMMUNITY): Payer: Self-pay | Admitting: *Deleted

## 2011-11-24 ENCOUNTER — Other Ambulatory Visit: Payer: Self-pay

## 2011-11-24 ENCOUNTER — Inpatient Hospital Stay (HOSPITAL_COMMUNITY): Payer: Medicare Other

## 2011-11-24 ENCOUNTER — Emergency Department (HOSPITAL_COMMUNITY): Payer: Medicare Other

## 2011-11-24 DIAGNOSIS — E785 Hyperlipidemia, unspecified: Secondary | ICD-10-CM

## 2011-11-24 DIAGNOSIS — F419 Anxiety disorder, unspecified: Secondary | ICD-10-CM

## 2011-11-24 DIAGNOSIS — Z79899 Other long term (current) drug therapy: Secondary | ICD-10-CM

## 2011-11-24 DIAGNOSIS — D72829 Elevated white blood cell count, unspecified: Secondary | ICD-10-CM

## 2011-11-24 DIAGNOSIS — R112 Nausea with vomiting, unspecified: Principal | ICD-10-CM

## 2011-11-24 DIAGNOSIS — M797 Fibromyalgia: Secondary | ICD-10-CM

## 2011-11-24 DIAGNOSIS — R000 Tachycardia, unspecified: Secondary | ICD-10-CM

## 2011-11-24 DIAGNOSIS — R519 Headache, unspecified: Secondary | ICD-10-CM

## 2011-11-24 DIAGNOSIS — F329 Major depressive disorder, single episode, unspecified: Secondary | ICD-10-CM

## 2011-11-24 DIAGNOSIS — G4700 Insomnia, unspecified: Secondary | ICD-10-CM

## 2011-11-24 DIAGNOSIS — R51 Headache: Secondary | ICD-10-CM

## 2011-11-24 LAB — BASIC METABOLIC PANEL
CO2: 22 mEq/L (ref 19–32)
Calcium: 9.7 mg/dL (ref 8.4–10.5)
Glucose, Bld: 139 mg/dL — ABNORMAL HIGH (ref 70–99)
Potassium: 3.6 mEq/L (ref 3.5–5.1)
Sodium: 139 mEq/L (ref 135–145)

## 2011-11-24 LAB — CULTURE, BLOOD (ROUTINE X 2): Culture  Setup Time: 201301091412

## 2011-11-24 LAB — CBC
Hemoglobin: 13.4 g/dL (ref 12.0–15.0)
Hemoglobin: 15 g/dL (ref 12.0–15.0)
MCH: 31.6 pg (ref 26.0–34.0)
MCH: 32.6 pg (ref 26.0–34.0)
MCHC: 35.1 g/dL (ref 30.0–36.0)
Platelets: 274 10*3/uL (ref 150–400)
RBC: 4.6 MIL/uL (ref 3.87–5.11)
RDW: 12.8 % (ref 11.5–15.5)
WBC: 15.2 10*3/uL — ABNORMAL HIGH (ref 4.0–10.5)

## 2011-11-24 LAB — T4: T4, Total: 7.9 ug/dL (ref 5.0–12.5)

## 2011-11-24 LAB — CREATININE, SERUM
Creatinine, Ser: 0.66 mg/dL (ref 0.50–1.10)
GFR calc non Af Amer: >90 mL/min (ref >90)

## 2011-11-24 LAB — CARDIAC PANEL(CRET KIN+CKTOT+MB+TROPI)
CK, MB: 2.9 ng/mL (ref 0.3–4.0)
Relative Index: 2.4 (ref 0.0–2.5)
Total CK: 119 U/L (ref 7–177)

## 2011-11-24 LAB — URINALYSIS, ROUTINE W REFLEX MICROSCOPIC
Ketones, ur: NEGATIVE mg/dL
Leukocytes, UA: NEGATIVE
Nitrite: NEGATIVE
Protein, ur: NEGATIVE mg/dL
pH: 7 (ref 5.0–8.0)

## 2011-11-24 LAB — POCT PREGNANCY, URINE: Preg Test, Ur: NEGATIVE

## 2011-11-24 LAB — HIV ANTIBODY (ROUTINE TESTING W REFLEX): HIV: NONREACTIVE

## 2011-11-24 LAB — PREGNANCY, URINE: Preg Test, Ur: NEGATIVE

## 2011-11-24 LAB — DIFFERENTIAL
Basophils Absolute: 0 10*3/uL (ref 0.0–0.1)
Lymphocytes Relative: 22 % (ref 12–46)
Lymphs Abs: 2.9 10*3/uL (ref 0.7–4.0)
Neutro Abs: 9.5 10*3/uL — ABNORMAL HIGH (ref 1.7–7.7)

## 2011-11-24 LAB — T4, FREE: Free T4: 1.21 ng/dL (ref 0.80–1.80)

## 2011-11-24 LAB — HEPATIC FUNCTION PANEL
AST: 15 U/L (ref 0–37)
Albumin: 3.4 g/dL — ABNORMAL LOW (ref 3.5–5.2)
Total Protein: 6.6 g/dL (ref 6.0–8.3)

## 2011-11-24 MED ORDER — GABAPENTIN 300 MG PO CAPS
600.0000 mg | ORAL_CAPSULE | Freq: Three times a day (TID) | ORAL | Status: DC
  Administered 2011-11-24 – 2011-11-25 (×3): 600 mg via ORAL
  Filled 2011-11-24 (×8): qty 2

## 2011-11-24 MED ORDER — DIPHENHYDRAMINE HCL 50 MG/ML IJ SOLN
25.0000 mg | Freq: Once | INTRAMUSCULAR | Status: AC
  Administered 2011-11-24: 25 mg via INTRAVENOUS

## 2011-11-24 MED ORDER — SODIUM CHLORIDE 0.9 % IV BOLUS (SEPSIS)
1000.0000 mL | Freq: Once | INTRAVENOUS | Status: AC
  Administered 2011-11-24: 1000 mL via INTRAVENOUS

## 2011-11-24 MED ORDER — ZOLPIDEM TARTRATE 5 MG PO TABS: 5.0000 mg | ORAL_TABLET | Freq: Every evening | ORAL | Status: DC | PRN

## 2011-11-24 MED ORDER — ONDANSETRON HCL 4 MG PO TABS: 4.0000 mg | ORAL_TABLET | Freq: Four times a day (QID) | ORAL | Status: DC | PRN

## 2011-11-24 MED ORDER — DIPHENHYDRAMINE HCL 50 MG/ML IJ SOLN
INTRAMUSCULAR | Status: AC
  Administered 2011-11-24: 25 mg via INTRAVENOUS
  Filled 2011-11-24: qty 1

## 2011-11-24 MED ORDER — PANTOPRAZOLE SODIUM 40 MG PO TBEC
40.0000 mg | DELAYED_RELEASE_TABLET | Freq: Every day | ORAL | Status: DC
  Administered 2011-11-24 – 2011-11-25 (×2): 40 mg via ORAL
  Filled 2011-11-24 (×2): qty 1

## 2011-11-24 MED ORDER — ONDANSETRON HCL 4 MG/2ML IJ SOLN
4.0000 mg | Freq: Once | INTRAMUSCULAR | Status: AC
  Administered 2011-11-24: 4 mg via INTRAVENOUS
  Filled 2011-11-24: qty 2

## 2011-11-24 MED ORDER — IOHEXOL 300 MG/ML  SOLN
40.0000 mL | Freq: Once | INTRAMUSCULAR | Status: AC | PRN
  Administered 2011-11-24: 40 mL via ORAL

## 2011-11-24 MED ORDER — LORAZEPAM 0.5 MG PO TABS: 0.5000 mg | ORAL_TABLET | Freq: Four times a day (QID) | ORAL | Status: DC | PRN

## 2011-11-24 MED ORDER — MORPHINE SULFATE 2 MG/ML IJ SOLN: 4.0000 mg | INTRAMUSCULAR | Status: DC | PRN

## 2011-11-24 MED ORDER — DIPHENHYDRAMINE HCL 25 MG PO TABS
50.0000 mg | ORAL_TABLET | Freq: Four times a day (QID) | ORAL | Status: DC | PRN
  Filled 2011-11-24: qty 2

## 2011-11-24 MED ORDER — OXYCODONE HCL 5 MG PO TABS
5.0000 mg | ORAL_TABLET | ORAL | Status: DC | PRN
  Administered 2011-11-25: 5 mg via ORAL
  Filled 2011-11-24: qty 1

## 2011-11-24 MED ORDER — ALBUTEROL SULFATE HFA 108 (90 BASE) MCG/ACT IN AERS
1.0000 | INHALATION_SPRAY | Freq: Four times a day (QID) | RESPIRATORY_TRACT | Status: DC | PRN
  Filled 2011-11-24: qty 6.7

## 2011-11-24 MED ORDER — IOHEXOL 300 MG/ML  SOLN
100.0000 mL | Freq: Once | INTRAMUSCULAR | Status: AC | PRN
  Administered 2011-11-24: 100 mL via INTRAVENOUS

## 2011-11-24 MED ORDER — ONDANSETRON HCL 4 MG/2ML IJ SOLN: 4.0000 mg | Freq: Four times a day (QID) | INTRAMUSCULAR | Status: DC | PRN

## 2011-11-24 MED ORDER — DIPHENHYDRAMINE HCL 50 MG/ML IJ SOLN
INTRAMUSCULAR | Status: AC
  Filled 2011-11-24: qty 1

## 2011-11-24 MED ORDER — HEPARIN SODIUM (PORCINE) 5000 UNIT/ML IJ SOLN
5000.0000 [IU] | Freq: Three times a day (TID) | INTRAMUSCULAR | Status: DC
  Administered 2011-11-24 – 2011-11-25 (×3): 5000 [IU] via SUBCUTANEOUS
  Filled 2011-11-24 (×6): qty 1

## 2011-11-24 MED ORDER — DROPERIDOL 2.5 MG/ML IJ SOLN
2.5000 mg | Freq: Once | INTRAMUSCULAR | Status: AC
  Administered 2011-11-24: 2.5 mg via INTRAVENOUS
  Filled 2011-11-24: qty 1

## 2011-11-24 MED ORDER — SODIUM CHLORIDE 0.9 % IV SOLN
INTRAVENOUS | Status: DC
  Administered 2011-11-24 – 2011-11-25 (×3): via INTRAVENOUS

## 2011-11-24 MED ORDER — ACETAMINOPHEN 500 MG PO TABS
1000.0000 mg | ORAL_TABLET | Freq: Four times a day (QID) | ORAL | Status: DC | PRN
  Filled 2011-11-24: qty 2

## 2011-11-24 NOTE — ED Notes (Signed)
Attempted to call report to 92, RN unavailable to take report at this time.  Gave my name and number and will await a call back.

## 2011-11-24 NOTE — ED Notes (Signed)
Patient restless, wanting to leave AMA, patient has agreed to be admitted to the floor.

## 2011-11-24 NOTE — ED Notes (Signed)
Pt c/o sudden onset of "burning up & anxiousness", pt sat up and is fanning self, EDP aware & in to see pt, benadryl ordered and given, also given ice packs, wet w/c & fan at [facility]. CT delivered 2 cups of PO contrast to BS. Pt drinking.

## 2011-11-24 NOTE — Progress Notes (Signed)
1730 cardiac monitor showing tachycardia 130's no ectopy  Denied palpitation , resting comfortably in bed . ivp infusion in progress as ordered . Referred pt to Dr Youlanda Roys EKG done and reviewed by MD . No further order given . Continue monitoring done

## 2011-11-24 NOTE — ED Notes (Signed)
Report given to Niles, RN on [phone]

## 2011-11-24 NOTE — ED Notes (Signed)
To CT, "not feeling any better", nausea and anxiousness remain.

## 2011-11-24 NOTE — ED Notes (Signed)
Pt reported some "shaking in bilateral hips & down thighs, like shivering, but not feeling cold", no h/o same, "resolving", denies need for any meds for this sx at this time, no changes, alert, NAD, calm, interactive, pending EDP eval & orders.

## 2011-11-24 NOTE — ED Notes (Signed)
Pt denied need for pain med.

## 2011-11-24 NOTE — ED Notes (Signed)
[facility] [place], here for nv, also reports HA, stomach ache and "feels dehydrated, states, "urine is strong and dark",  (Denies: other urinary or vaginal sx, fever, cough, cold sx, diarrhea, congestion, dizziness, sob or weakness), nv ongoing for ~ 1 month, worse tonight, exacerbation onset around 2130. "No longer emesis, just spit", mentions "has not had regular meds in 2 days". Also states, "~ 1 month ago had stomach virus with nvd".

## 2011-11-24 NOTE — Progress Notes (Signed)
  Echocardiogram 2D Echocardiogram has been performed.  Gwendolyn Green Deneen 11/24/2011, 2:23 PM

## 2011-11-24 NOTE — H&P (Signed)
Hospital Admission Note Date: 11/24/2011  Patient name: Gwendolyn Green Medical record number: [id number] Date of birth: 1966-04-23 Age: 46 y.o. Gender: female PCP: [facility]  Medical Service: Maurice March  Attending physician: Dr. Aundria Rud     1st Contact:     Dr. Clyde Lundborg   Pager: [phone] 2nd Contact:  Dr. Allena Katz              Pager: [phone]  After 5 pm or weekends: 1st Contact:      Pager: [phone] 2nd Contact:      Pager: [phone]   Chief Complaint: Persistent nausea and vomiting  History of Present Illness: Patient is a 46 year old female with a past medical history significant for depression, anxiety and fibromyalgia presents to the emergency room with persistent nausea, loose stools for the past month, and vomiting that started last night which prompted the patient to come to the emergency room for further evaluation. Patient reports that this was her first episode of vomitus, describes it as nonbilious, and nonbloody. As for her loose stools she describes it as up to 4 loose stools a day, with orange color. Patient denies any recent medication, environmental, or dietary changes. Denies any alcohol or drug abuse. Patient today also reports migraine and photophobia which she feels may be contributing to her nausea. Patient also reports chills but denies fever or recent sick contacts. In the ED she was found to be tachycardic which persisted after normal saline boluses, also was found to have leukocytosis which was unexplained, and her nausea persisted after medication administration, therefore the ED physician requested admission for persistent nausea and vomiting and further workup of her unusual presentation and combination of symptoms.   Meds: Medications Prior to Admission  Medication Dose Route Frequency Provider Last Rate Last Dose  . diphenhydrAMINE (BENADRYL) injection 25 mg  25 mg Intravenous Once Nelia Shi, MD   25 mg at 11/24/11 0502  . diphenhydrAMINE (BENADRYL) injection 25 mg  25  mg Intravenous Once Nelia Shi, MD   25 mg at 11/24/11 0542  . droperidol (INAPSINE) injection 2.5 mg  2.5 mg Intravenous Once Nelia Shi, MD   2.5 mg at 11/24/11 0446  . iohexol (OMNIPAQUE) 300 MG/ML solution 100 mL  100 mL Intravenous Once PRN Medication Radiologist   100 mL at 11/24/11 0620  . iohexol (OMNIPAQUE) 300 MG/ML solution 40 mL  40 mL Oral Once PRN Medication Radiologist   40 mL at 11/24/11 0620  . ondansetron (ZOFRAN) injection 4 mg  4 mg Intravenous Once Lyanne Co, MD   4 mg at 11/24/11 0315  . sodium chloride 0.9 % bolus 1,000 mL  1,000 mL Intravenous Once Lyanne Co, MD   1,000 mL at 11/24/11 0315  . sodium chloride 0.9 % bolus 1,000 mL  1,000 mL Intravenous Once Nelia Shi, MD   1,000 mL at 11/24/11 0435   Medications Prior to Admission  Medication Sig Dispense Refill  . acetaminophen (TYLENOL) 500 MG tablet Take 1,000 mg by mouth every 6 (six) hours as needed. For pain       . albuterol (PROVENTIL HFA;VENTOLIN HFA) 108 (90 BASE) MCG/ACT inhaler Inhale 1-2 puffs into the lungs every 6 (six) hours as needed for wheezing.  1 Inhaler  0  . diphenhydrAMINE (BENADRYL) 25 MG tablet Take 50 mg by mouth every 6 (six) hours as needed. For sleep       . fish oil-omega-3 fatty acids 1000 MG capsule Take 2 g by mouth  daily.        . gabapentin (NEURONTIN) 300 MG capsule Take 600 mg by mouth 3 (three) times daily.        Marland Kitchen LORazepam (ATIVAN PO) Take 1 tablet by mouth 2 (two) times daily. Unknown dose       . ondansetron (ZOFRAN) 4 MG tablet Take 1 tablet (4 mg total) by mouth every 6 (six) hours.  8 tablet  0  . pantoprazole (PROTONIX) 20 MG tablet Take 2 tablets (40 mg total) by mouth daily.  60 tablet  1  . QUETIAPINE FUMARATE PO Take 1 tablet by mouth daily. Unknown dose       . zolpidem (AMBIEN) 5 MG tablet Take 2.5 mg by mouth at bedtime as needed.          Allergies: Review of patient's allergies indicates no known allergies. Past Medical History  Diagnosis  Date  . Depressed   . Fibromyalgia    Past Surgical History  Procedure Date  . Cholecystectomy    History reviewed. No pertinent family history. History   Social History  . Marital Status: Divorced    Spouse Name: N/A    Number of Children: N/A  . Years of Education: N/A   Occupational History  . Not on file.   Social History Main Topics  . Smoking status: Former Games developer  . Smokeless tobacco: Not on file  . Alcohol Use: Yes  . Drug Use: No  . Sexually Active:    Other Topics Concern  . Not on file   Social History Narrative  . 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  Review of Systems: Negative except per history of present illness  Physical Exam: Blood pressure 133/84, pulse 118, temperature 98.2 F (36.8 C), temperature source Oral, resp. rate 18, last menstrual period 11/04/2011, SpO2 94.00%. General:  alert, well-developed, and cooperative to examination.  In no acute distress.  Head:  normocephalic and atraumatic.   Eyes:  vision grossly intact, pupils equal, pupils round, pupils reactive to light, no injection and anicteric.   Mouth:  pharynx pink and moist, no erythema, and no exudates.   Neck:  supple, full ROM, no thyromegaly, no JVD, and no carotid bruits.  Back has buffalo hump. Lungs:  normal respiratory effort, no accessory muscle use, normal breath sounds, no crackles, and no wheezes. Heart:  tachycardic, regular rhythm, no murmur, no gallop, and no rub.   Abdomen:  soft, non-tender, normal bowel sounds, no distention, no guarding, no rebound tenderness, no hepatomegaly, and no splenomegaly.   Msk:  no joint swelling, no joint warmth, and no redness over joints.   Pulses:  2+ DP/PT pulses bilaterally Extremities:  No cyanosis, clubbing, edema Neurologic:  alert & oriented X3, cranial nerves II-XII intact, strength normal in all  extremities, sensation intact to light touch, and gait normal.   Skin:  turgor normal and no rashes.   Psych:  Oriented X3, memory intact for recent and remote, normally interactive, good eye contact, not anxious appearing, and not depressed appearing.   Lab results: Basic Metabolic Panel:  Basename 11/24/11 0004  NA 139  K 3.6  CL 100  CO2 22  GLUCOSE 139*  BUN 14  CREATININE 0.75  CALCIUM 9.7  MG --  PHOS --   CBC:  Basename 11/24/11 0004  WBC 15.2*  NEUTROABS --  HGB 15.0  HCT 40.9  MCV 88.9  PLT 274    Imaging results:  Ct Abdomen Pelvis W Contrast  11/24/2011  *RADIOLOGY REPORT*  Clinical Data: Vomiting and diarrhea for 1 month.  Headache.  CT ABDOMEN AND PELVIS WITH CONTRAST  Technique:  Multidetector CT imaging of the abdomen and pelvis was performed following the standard protocol during bolus administration of intravenous contrast.  Contrast: OMNIPAQUE IOHEXOL 300 MG/ML IV SOLN, 40mL OMNIPAQUE IOHEXOL 300 MG/ML IV SOLN  Comparison: None.  Findings: Dependent atelectasis at the lung bases.  Fatty liver. Cholecystectomy.   Spleen appears normal.  Adrenal glands normal.  Normal renal enhancement and delayed excretion of contrast bilaterally.  The pancreas and common bile duct are normal.  No calcified gallstones are identified.  The distal esophagus and stomach appear within normal limits.  Normal appendix.  Small bowel view is normal diameter without inflammatory changes.  The urinary bladder appears normal.  Both ureters can be followed to the bladder.  Uterus and adnexa normal. Colon appears within normal limits.  No aggressive osseous lesions are present.  IMPRESSION: No acute abnormality.  Fatty liver and cholecystectomy.  Original Report Authenticated By: Andreas Newport, M.D.    Assessment & Plan by Problem:   Nausea, vomiting and diarrhea- ongoing for the past month, no clear etiology at this point seen differential is broad and can be due to infection,  pancreatic, gastric irritation (over patient denies excessive NSAID or alcohol use),  Intracranial abnormalities especially given her headache and photophobia and body habitus may be representative of pseudotumor cerebri, endocrine such as thyroid disease, less likely paraneoplastic syndrome or cardiovascular. Pregnancy has been ruled out with negative pregnancy test, CT of the abdomen and pelvis also ruled out other obvious pathologies. Given the patient's persistent nausea and vomiting in the ED despite administration of antiemetics, we will admit patient for further workup. Plan -Admit for monitoring and hydration given the patient's inability to tolerate by mouth intake. -Advance diet as tolerated -IV fluids at 125 cc per hour -Provide Zofran as needed for nausea -Head CT to rule out obvious pathology.  -lipase, LFT, TSH, UDS, CE x1. -Stool C. Difficile, if negative and the patients loose stools persist, consider checking further stool studies.   Sinus Tachycardia-  with rate 100-120 which was present on arrival in the ED and persisted after 2 L bolus of IV fluids. currently unclear cause,may be related to primary problem, Main DDx inculde: drugs, hyperthyroidism, volume contraction, less likely hormonal/endocrine such as cushings or thyroid abnormalities, infectious or malignant process, however currently no evidence of the latter.  Plan -2D Echo to rule out wall motion abnormalities -EKG -Cardiac enzymes x1 -TSH, Free T4, UDS, HIV, ESR, ANA, and late evening serum cortisol. -Blood cultures, Urinalysis, chest x-ray    Leukocytosis- patient has a persistent WBC of 15.2. Patient's white count has been elevated for the past several visits to the hospital, this may be due to an underlying infection vs stress related due to an acute presentation vs less likely immunological, hormonal or occult malignancy. Patient is currently afebrile, abdominal CT that was performed in the emergency room is  negative for any signs of infection. check a differential, UA, CXR to rule out obvious infection. We will not start antibiotics at this point, given the lack of evidence for infection, plan as above.   Headache- patient complains of headache and photophobia since last night, patient reports this is ongoing on and off for the past several months, given persistent nausea  and vomiting, we will check head CT to rule out intracranial abnormalities.   Depression- currently denies suicidal or homicidal ideation, was on an antidepressant but this was stopped in December due to side effects.   Hyperlipidemia- hold Lopid and omega-3 fatty acids given acute presentation and check a.m. fasting lipid.   Anxiety- continue Ativan as needed.   Fibromyalgia- continue gabapentin.   Insomnia- continue zolpidem as needed.  DVT prophylaxis- heparin 3 times a day.  SignedDarnelle Maffucci 11/24/2011, 8:18 AM

## 2011-11-24 NOTE — ED Provider Notes (Signed)
History     CSN: [id number]  Arrival date & time 11/23/11  2338   First MD Initiated Contact with Patient 11/24/11 [phone]      Chief Complaint  Patient presents with  . Emesis    (Consider location/radiation/quality/duration/timing/severity/associated sxs/prior treatment) HPI Patient presents with vomiting and diarrhea for the last 3-4 weeks.  Associated with a headache this evening.  Has history of fibromyalgia and  Depression.  Patient went to her PMD 2 days ago.  She is currently in a workup for her chronic diarrhea.  Tonight she developed persistent vomiting with that was uncontrollable.  She really denies any serious abdominal pain.  Denies fever.  Past surgical history is pertinent for a cholecystectomy. Past Medical History  Diagnosis Date  . Depressed   . Fibromyalgia     Past Surgical History  Procedure Date  . Cholecystectomy     History reviewed. No pertinent family history.  History  Substance Use Topics  . Smoking status: Former Games developer  . Smokeless tobacco: Not on file  . Alcohol Use: Yes    OB History    Grav Para Term Preterm Abortions TAB SAB Ect Mult Living                  Review of Systems  All other systems reviewed and are negative.    Allergies  Review of patient's allergies indicates no known allergies.  Home Medications   Current Outpatient Rx  Name Route Sig Dispense Refill  . ACETAMINOPHEN 500 MG PO TABS Oral Take 1,000 mg by mouth every 6 (six) hours as needed. For pain     . ALBUTEROL SULFATE HFA 108 (90 BASE) MCG/ACT IN AERS Inhalation Inhale 1-2 puffs into the lungs every 6 (six) hours as needed for wheezing. 1 Inhaler 0  . DIPHENHYDRAMINE HCL 25 MG PO TABS Oral Take 50 mg by mouth every 6 (six) hours as needed. For sleep     . OMEGA-3 FATTY ACIDS 1000 MG PO CAPS Oral Take 2 g by mouth daily.      Marland Kitchen GABAPENTIN 300 MG PO CAPS Oral Take 600 mg by mouth 3 (three) times daily.      Marland Kitchen GEMFIBROZIL 600 MG PO TABS Oral Take 600 mg by  mouth 2 (two) times daily before a meal.      . ATIVAN PO Oral Take 1 tablet by mouth 2 (two) times daily. Unknown dose     . ONDANSETRON HCL 4 MG PO TABS Oral Take 1 tablet (4 mg total) by mouth every 6 (six) hours. 8 tablet 0  . PANTOPRAZOLE SODIUM 20 MG PO TBEC Oral Take 2 tablets (40 mg total) by mouth daily. 60 tablet 1  . QUETIAPINE FUMARATE PO Oral Take 1 tablet by mouth daily. Unknown dose     . ZOLPIDEM TARTRATE 5 MG PO TABS Oral Take 2.5 mg by mouth at bedtime as needed.        BP 133/84  Pulse 118  Temp(Src) 98.2 F (36.8 C) (Oral)  Resp 18  SpO2 94%  LMP 11/04/2011  Physical Exam  ED Course  Procedures (including critical care time)  Labs Reviewed  CBC - Abnormal; Notable for the following:    WBC 15.2 (*)    MCHC 36.7 (*)    All other components within normal limits  BASIC METABOLIC PANEL - Abnormal; Notable for the following:    Glucose, Bld 139 (*)    All other components within normal limits  URINALYSIS, ROUTINE  W REFLEX MICROSCOPIC - Abnormal; Notable for the following:    APPearance CLOUDY (*)    Glucose, UA 250 (*)    All other components within normal limits  PREGNANCY, URINE  POCT PREGNANCY, URINE  TSH  T4   I spoke with him outpatient clinics and they will come to emergency room and admit the patient. Ct Abdomen Pelvis W Contrast  11/24/2011  *RADIOLOGY REPORT*  Clinical Data: Vomiting and diarrhea for 1 month.  Headache.  CT ABDOMEN AND PELVIS WITH CONTRAST  Technique:  Multidetector CT imaging of the abdomen and pelvis was performed following the standard protocol during bolus administration of intravenous contrast.  Contrast: OMNIPAQUE IOHEXOL 300 MG/ML IV SOLN, 40mL OMNIPAQUE IOHEXOL 300 MG/ML IV SOLN  Comparison: None.  Findings: Dependent atelectasis at the lung bases.  Fatty liver. Cholecystectomy.   Spleen appears normal.  Adrenal glands normal.  Normal renal enhancement and delayed excretion of contrast bilaterally.  The pancreas and common  bile duct are normal.  No calcified gallstones are identified.  The distal esophagus and stomach appear within normal limits.  Normal appendix.  Small bowel view is normal diameter without inflammatory changes.  The urinary bladder appears normal.  Both ureters can be followed to the bladder.  Uterus and adnexa normal. Colon appears within normal limits.  No aggressive osseous lesions are present.  IMPRESSION: No acute abnormality.  Fatty liver and cholecystectomy.  Original Report Authenticated By: Andreas Newport, M.D.     1. Persistent vomiting   2. Tachycardia   3. Leukocytosis       MDM         Nelia Shi, MD 11/24/11 [phone]

## 2011-11-24 NOTE — ED Notes (Signed)
Pt put on cardiac monitor and pulse ox. 

## 2011-11-25 LAB — LIPID PANEL
Cholesterol: 175 mg/dL (ref 0–200)
Total CHOL/HDL Ratio: 5.8 RATIO
Triglycerides: 210 mg/dL — ABNORMAL HIGH (ref <150)
VLDL: 42 mg/dL — ABNORMAL HIGH (ref 0–40)

## 2011-11-25 LAB — BASIC METABOLIC PANEL
BUN: 7 mg/dL (ref 6–23)
Creatinine, Ser: 0.76 mg/dL (ref 0.50–1.10)
GFR calc non Af Amer: >90 mL/min (ref >90)
Glucose, Bld: 124 mg/dL — ABNORMAL HIGH (ref 70–99)
Potassium: 3.4 mEq/L — ABNORMAL LOW (ref 3.5–5.1)

## 2011-11-25 LAB — CBC
HCT: 38.4 % (ref 36.0–46.0)
Hemoglobin: 13.3 g/dL (ref 12.0–15.0)
MCH: 31.7 pg (ref 26.0–34.0)
MCHC: 34.6 g/dL (ref 30.0–36.0)
RDW: 12.9 % (ref 11.5–15.5)

## 2011-11-25 LAB — RAPID URINE DRUG SCREEN, HOSP PERFORMED: Barbiturates: NOT DETECTED

## 2011-11-25 LAB — CARDIAC PANEL(CRET KIN+CKTOT+MB+TROPI)
CK, MB: 2.5 ng/mL (ref 0.3–4.0)
Relative Index: 2.4 (ref 0.0–2.5)
Total CK: 103 U/L (ref 7–177)
Total CK: 106 U/L (ref 7–177)

## 2011-11-25 LAB — CLOSTRIDIUM DIFFICILE BY PCR: Toxigenic C. Difficile by PCR: NEGATIVE

## 2011-11-25 LAB — CORTISOL-PM, BLOOD: Cortisol - PM: 2.9 ug/dL — ABNORMAL LOW (ref 3.1–16.7)

## 2011-11-25 MED ORDER — DICYCLOMINE HCL 20 MG PO TABS: 20.0000 mg | ORAL_TABLET | Freq: Three times a day (TID) | ORAL | Status: DC

## 2011-11-25 MED ORDER — POTASSIUM CHLORIDE CRYS ER 20 MEQ PO TBCR
40.0000 meq | EXTENDED_RELEASE_TABLET | Freq: Once | ORAL | Status: AC
  Administered 2011-11-25: 40 meq via ORAL
  Filled 2011-11-25: qty 2

## 2011-11-25 NOTE — Discharge Summary (Signed)
Patient Name:  Gwendolyn Green  MRN: [id number]  PCP: [facility]  DOB:  June 07, 1966   CSN: [id number]     Date of Admission:  11/23/2011  Date of Discharge:  11/25/2011      Attending Physician: Dr. Ulyess Mort         DISCHARGE DIAGNOSES: Principal Problem:  *Nausea & vomiting Active Problems:  Tachycardia  Leukocytosis  Headache  Depression  Hyperlipidemia  Anxiety  Fibromyalgia  Insomnia   DISPOSITION AND FOLLOW-UP:  Gwendolyn Green is to follow-up with her PCP in [place] hospital. Patient promised that she will call for an appointment by herself in 2 weeks. In her visit, please check her CBC to make sure that her leukocytosis has resolved completely.  Follow-up Information    Follow up with [facility]. (please do not forget to call for  a follow-up visit to your PCP in [place] hosptial. )         Discharge Orders    Future Orders Please Complete By Expires   Diet general      Increase activity slowly      Call MD for:  temperature >100.4      Call MD for:  persistant nausea and vomiting      Call MD for:  severe uncontrolled pain      Call MD for:  difficulty breathing, headache or visual disturbances      Call MD for:  persistant dizziness or light-headedness      Call MD for:  extreme fatigue          DISCHARGE MEDICATIONS: Current Discharge Medication List    START taking these medications   Details  dicyclomine (BENTYL) 20 MG tablet Take 1 tablet (20 mg total) by mouth 3 (three) times daily before meals. Qty: 60 tablet, Refills: 1      CONTINUE these medications which have NOT CHANGED   Details  acetaminophen (TYLENOL) 500 MG tablet Take 1,000 mg by mouth every 6 (six) hours as needed. For pain     albuterol (PROVENTIL HFA;VENTOLIN HFA) 108 (90 BASE) MCG/ACT inhaler Inhale 1-2 puffs into the lungs every 6 (six) hours as needed for wheezing. Qty: 1 Inhaler, Refills: 0    diphenhydrAMINE (BENADRYL) 25 MG tablet Take 50 mg by mouth every 6 (six) hours as  needed. For sleep     fish oil-omega-3 fatty acids 1000 MG capsule Take 2 g by mouth daily.      gabapentin (NEURONTIN) 300 MG capsule Take 600 mg by mouth 3 (three) times daily.      gemfibrozil (LOPID) 600 MG tablet Take 600 mg by mouth 2 (two) times daily before a meal.      LORazepam (ATIVAN PO) Take 1 tablet by mouth 2 (two) times daily. Unknown dose     ondansetron (ZOFRAN) 4 MG tablet Take 1 tablet (4 mg total) by mouth every 6 (six) hours. Qty: 8 tablet, Refills: 0    pantoprazole (PROTONIX) 20 MG tablet Take 2 tablets (40 mg total) by mouth daily. Qty: 60 tablet, Refills: 1    QUETIAPINE FUMARATE PO Take 1 tablet by mouth daily. Unknown dose     zolpidem (AMBIEN) 5 MG tablet Take 2.5 mg by mouth at bedtime as needed.           CONSULTS: none     PROCEDURES PERFORMED:  X-ray Chest Pa And Lateral   11/24/2011  *RADIOLOGY REPORT*  Clinical Data: Tachycardia.  Nausea and vomiting.  CHEST - 2  VIEW 11/24/2011:  Comparison: Two-view chest x-ray 10/25/2011 [facility], 07/01/2009 [facility].  Findings: Cardiomediastinal silhouette unremarkable and unchanged. Lungs clear.  Bronchovascular markings normal.  No pleural effusions.  Visualized bony thorax intact.  No significant interval change.  IMPRESSION: Normal and stable examination.  Original Report Authenticated By: Arnell Sieving, M.D.   Ct Head Wo Contrast  11/24/2011  *RADIOLOGY REPORT*  Clinical Data:  Headache with nausea and vomiting.  CT HEAD WITHOUT CONTRAST  Technique:  Contiguous axial images were obtained from the base of the skull through the vertex without contrast  Comparison:  MRI 09/25/2004  Findings:  The brain has a normal appearance without evidence for hemorrhage, acute infarction, hydrocephalus, or mass lesion.  There is no extra axial fluid collection.  The skull and paranasal sinuses are normal.  IMPRESSION: Normal CT of the head without contrast.  Original Report Authenticated By: Camelia Phenes, M.D.   Ct Abdomen Pelvis W Contrast  11/24/2011  *RADIOLOGY REPORT*  Clinical Data: Vomiting and diarrhea for 1 month.  Headache.  CT ABDOMEN AND PELVIS WITH CONTRAST  Technique:  Multidetector CT imaging of the abdomen and pelvis was performed following the standard protocol during bolus administration of intravenous contrast.  Contrast: OMNIPAQUE IOHEXOL 300 MG/ML IV SOLN, 40mL OMNIPAQUE IOHEXOL 300 MG/ML IV SOLN  Comparison: None.  Findings: Dependent atelectasis at the lung bases.  Fatty liver. Cholecystectomy.   Spleen appears normal.  Adrenal glands normal.  Normal renal enhancement and delayed excretion of contrast bilaterally.  The pancreas and common bile duct are normal.  No calcified gallstones are identified.  The distal esophagus and stomach appear within normal limits.  Normal appendix.  Small bowel view is normal diameter without inflammatory changes.  The urinary bladder appears normal.  Both ureters can be followed to the bladder.  Uterus and adnexa normal. Colon appears within normal limits.  No aggressive osseous lesions are present.  IMPRESSION: No acute abnormality.  Fatty liver and cholecystectomy.  Original Report Authenticated By: Andreas Newport, M.D.       ADMISSION DATA: H&P: Patient is a 46 year old female with a past medical history significant for depression, anxiety and fibromyalgia presents to the emergency room with persistent nausea, loose stools for the past month, and vomiting that started last night which prompted the patient to come to the emergency room for further evaluation. Patient reports that this was her first episode of vomitus, describes it as nonbilious, and nonbloody. As for her loose stools she describes it as up to 4 loose stools a day, with orange color. Patient denies any recent medication, environmental, or dietary changes. Denies any alcohol or drug abuse. Patient today also reports migraine and photophobia which she feels may be  contributing to her nausea. Patient also reports chills but denies fever or recent sick contacts. In the ED she was found to be tachycardic which persisted after normal saline boluses, also was found to have leukocytosis which was unexplained, and her nausea persisted after medication administration, therefore the ED physician requested admission for persistent nausea and vomiting and further workup of her unusual presentation and combination of symptoms.    Physical Exam: Blood pressure 133/84, pulse 118, temperature 98.2 F (36.8 C), temperature source Oral, resp. rate 18, last menstrual period 11/04/2011, SpO2 94.00%. General:  alert, well-developed, and cooperative to examination.  In no acute distress.  Head:  normocephalic and atraumatic.   Eyes:  vision grossly intact, pupils equal, pupils round, pupils reactive to  light, no injection and anicteric.   Mouth:  pharynx pink and moist, no erythema, and no exudates.   Neck:  supple, full ROM, no thyromegaly, no JVD, and no carotid bruits.  Back has buffalo hump. Lungs:  normal respiratory effort, no accessory muscle use, normal breath sounds, no crackles, and no wheezes. Heart:  tachycardic, regular rhythm, no murmur, no gallop, and no rub.   Abdomen:  soft, non-tender, normal bowel sounds, no distention, no guarding, no rebound tenderness, no hepatomegaly, and no splenomegaly.   Msk:  no joint swelling, no joint warmth, and no redness over joints.   Pulses:  2+ DP/PT pulses bilaterally Extremities:  No cyanosis, clubbing, edema Neurologic:  alert & oriented X3, cranial nerves II-XII intact, strength normal in all extremities, sensation intact to light touch, and gait normal.   Skin:  turgor normal and no rashes.   Psych:  Oriented X3, memory intact for recent and remote, normally interactive, good eye contact, not anxious appearing, and not depressed appearing.   Lab results: Basic Metabolic Panel:  Basename  11/24/11 0004   NA  139     K  3.6   CL  100   CO2  22   GLUCOSE  139*   BUN  14   CREATININE  0.75   CALCIUM  9.7   MG  --   PHOS  --    CBC:  Basename  11/24/11 0004   WBC  15.2*   NEUTROABS  --   HGB  15.0   HCT  40.9   MCV  88.9   PLT  274    HOSPITAL COURSE:  # Nausea, vomiting and diarrhea: Patient has nausea and diarrhea ongoing for the past month, and several episodes of vomiting and transient headache and photophobia for one night. Etiology is not clear. The relative chronic course of nausea and diarrhea can be due to functional gastrointestinal disorder, e.g. irritable bowel syndrome. The acute episodes of vomiting can be due to acute gastroenteritis. Stool C. Difficile PCR is negative. Patient is observed and treated with hydration, Zofran as needed for nausea, and pantoprazole. She responded to the regimen well. At discharge, she does not have nausea, vomiting, but still has loose stool. She has no fever and other systemic manifestation of infection. She is discharged home with Bentyl to improve abnormal bowel movements, and is to follow up symptom improvement with her PCP in [place] hospital in 2 weeks. Patient promised to call for an appointment by herself in 2 weeks.  # Sinus Tachycardia: Heart rate 100-120 on arrival in the ED. May be related to primary problem. Currently ~ 100. EKG, Cardiac enzymes, TSH and TT4 (-). Please follow up in her visit.  # Leukocytosis: patient has a WBC of 15.2 on admission. Patient's white count has been elevated for the past several visits to the hospital, this may be due to an underlying infection vs stress. Patient is afebrile and has no evidence for infection. Please check her CBC in her visit to make sure that her leukocytosis resolved completely. Otherwise, please consider other causes, such as blood disorder.   # Headache: Patient complains of headache and photophobia the night of vomiting episodes. Head CT scan is normal and the symptoms relieved the next morning.  May be related to vomiting episodes.  Anxiety: stable in the hospital. continued Ativan as needed.  Fibromyalgia: no new issue, continued gabapentin.  DISCHARGE DATA: Vital Signs: BP 129/91  Pulse 104  Temp(Src) 97.9 F (36.6  C) (Oral)  Resp 18  Wt 209 lb 14.4 oz (95.21 kg)  SpO2 98%  LMP 11/03/2011  Labs: Results for orders placed during the hospital encounter of 11/23/11 (from the past 24 hour(s))  CARDIAC PANEL(CRET KIN+CKTOT+MB+TROPI)     Status: Normal   Collection Time   11/24/11  4:30 PM      Component Value Range   Total CK 119  7 - 177 (U/L)   CK, MB 2.9  0.3 - 4.0 (ng/mL)   Troponin I <0.30  <0.30 (ng/mL)   Relative Index 2.4  0.0 - 2.5   CORTISOL-PM, BLOOD     Status: Abnormal   Collection Time   11/24/11  9:37 PM      Component Value Range   Cortisol - PM 2.9 (*) 3.1 - 16.7 (ug/dL)  CARDIAC PANEL(CRET KIN+CKTOT+MB+TROPI)     Status: Normal   Collection Time   11/25/11 12:03 AM      Component Value Range   Total CK 103  7 - 177 (U/L)   CK, MB 2.5  0.3 - 4.0 (ng/mL)   Troponin I <0.30  <0.30 (ng/mL)   Relative Index 2.4  0.0 - 2.5   BASIC METABOLIC PANEL     Status: Abnormal   Collection Time   11/25/11  8:10 AM      Component Value Range   Sodium 138  135 - 145 (mEq/L)   Potassium 3.4 (*) 3.5 - 5.1 (mEq/L)   Chloride 103  96 - 112 (mEq/L)   CO2 25  19 - 32 (mEq/L)   Glucose, Bld 124 (*) 70 - 99 (mg/dL)   BUN 7  6 - 23 (mg/dL)   Creatinine, Ser 4.54  0.50 - 1.10 (mg/dL)   Calcium 9.0  8.4 - 09.8 (mg/dL)   GFR calc non Af Amer >90  >90 (mL/min)   GFR calc Af Amer >90  >90 (mL/min)  CBC     Status: Abnormal   Collection Time   11/25/11  8:10 AM      Component Value Range   WBC 13.6 (*) 4.0 - 10.5 (K/uL)   RBC 4.20  3.87 - 5.11 (MIL/uL)   Hemoglobin 13.3  12.0 - 15.0 (g/dL)   HCT 11.9  14.7 - 82.9 (%)   MCV 91.4  78.0 - 100.0 (fL)   MCH 31.7  26.0 - 34.0 (pg)   MCHC 34.6  30.0 - 36.0 (g/dL)   RDW 56.2  13.0 - 86.5 (%)   Platelets 237  150 - 400 (K/uL)   CARDIAC PANEL(CRET KIN+CKTOT+MB+TROPI)     Status: Normal   Collection Time   11/25/11  8:10 AM      Component Value Range   Total CK 106  7 - 177 (U/L)   CK, MB 2.4  0.3 - 4.0 (ng/mL)   Troponin I <0.30  <0.30 (ng/mL)   Relative Index 2.3  0.0 - 2.5   LIPID PANEL     Status: Abnormal   Collection Time   11/25/11  8:10 AM      Component Value Range   Cholesterol 175  0 - 200 (mg/dL)   Triglycerides 784 (*) <150 (mg/dL)   HDL 30 (*) >69 (mg/dL)   Total CHOL/HDL Ratio 5.8     VLDL 42 (*) 0 - 40 (mg/dL)   LDL Cholesterol 629 (*) 0 - 99 (mg/dL)      Signed: Lorretta Harp, MD PGY I, Internal Medicine Resident 11/25/2011, 2:04 PM

## 2011-11-25 NOTE — Progress Notes (Signed)
Verbalized understanding of discharge instructions.  Client driving self home.  Alert and oriented.  Cooperative.

## 2011-11-25 NOTE — Progress Notes (Signed)
This is the attending admission note for this patient.  46 yo woman with 1 month of low-grade GI sx, mostly nausea, anorexia, and decreased appetite.  Asmitted for 1 day of more intense sx with vomiting and mild abdominal pain and headache.  Normal vital signs, and all labs normal except WBC of 15,000.  Overnight, all sx much improved: no vomiting, T max = 98.  Eating full liquids w/o problem. Physical exam normal. She has a [place] doctor who is already investigating her subacute GI sx.  Because of prominent gastro-colic reflex, we could start her on dicyclomine TID before meals.  Defer further w/u to her PCP.

## 2012-05-31 ENCOUNTER — Emergency Department (HOSPITAL_COMMUNITY)
Admission: EM | Admit: 2012-05-31 | Discharge: 2012-05-31 | Disposition: A | Discharge status: Home / Self Care | Payer: Medicare Other | Attending: Emergency Medicine | Admitting: Emergency Medicine

## 2012-05-31 ENCOUNTER — Encounter (HOSPITAL_COMMUNITY): Payer: Self-pay | Admitting: *Deleted

## 2012-05-31 DIAGNOSIS — R21 Rash and other nonspecific skin eruption: Secondary | ICD-10-CM | POA: Insufficient documentation

## 2012-05-31 DIAGNOSIS — IMO0001 Reserved for inherently not codable concepts without codable children: Secondary | ICD-10-CM | POA: Insufficient documentation

## 2012-05-31 DIAGNOSIS — I10 Essential (primary) hypertension: Secondary | ICD-10-CM | POA: Insufficient documentation

## 2012-05-31 DIAGNOSIS — T50905A Adverse effect of unspecified drugs, medicaments and biological substances, initial encounter: Secondary | ICD-10-CM

## 2012-05-31 DIAGNOSIS — L270 Generalized skin eruption due to drugs and medicaments taken internally: Secondary | ICD-10-CM | POA: Insufficient documentation

## 2012-05-31 NOTE — ED Notes (Signed)
MD at bedside. 

## 2012-05-31 NOTE — ED Notes (Signed)
Pt reports starting a new med one week ago, noticed red rash to torso that started today. Denies itching. Airway intact

## 2012-05-31 NOTE — ED Provider Notes (Signed)
History   This chart was scribed for Nelia Shi, MD by Gwendolyn Green. The patient was seen in room TR10C/TR10C and the patient's care was started at 7:01 PM     CSN: [id number]  Arrival date & time 05/31/12  1830   First MD Initiated Contact with Patient 05/31/12 1900      Chief Complaint  Patient presents with  . Rash    (Consider location/radiation/quality/duration/timing/severity/associated sxs/prior treatment) Patient is a 46 y.o. female presenting with rash. The history is provided by the patient. No language interpreter was used.  Rash  This is a new problem. The current episode started 3 to 5 hours ago. The problem has not changed since onset.The problem is associated with new medications. There has been no fever. The rash is present on the torso. The pain is mild. The pain has been constant since onset. Pertinent negatives include no itching. She has tried nothing for the symptoms. The treatment provided no relief. Risk factors include new medications.    Gwendolyn Green is a 46 y.o. female who presents to the Emergency Department complaining of moderate, episodic rash located at the torso onset today. The pt informs the EDP that she was put on new medication last Wednesday(Lamictal), 05/24/12. Pt has a hx of taking Abilify (since the end of last year), fibromyalgia, depression.    Psychiatrist is Dr. Casimiro Needle (Psychiatrist at [facility]).   Past Medical History  Diagnosis Date  . Depressed   . Fibromyalgia     Past Surgical History  Procedure Date  . Cholecystectomy     History reviewed. No pertinent family history.  History  Substance Use Topics  . Smoking status: Former Games developer  . Smokeless tobacco: Not on file  . Alcohol Use: Yes    OB History    Grav Para Term Preterm Abortions TAB SAB Ect Mult Living                  Review of Systems  Skin: Positive for rash. Negative for itching.  All other systems reviewed and are negative.    10 Systems reviewed and  all are negative for acute change except as noted in the HPI.    Allergies  Review of patient's allergies indicates no known allergies.  Home Medications   Current Outpatient Rx  Name Route Sig Dispense Refill  . ALBUTEROL SULFATE HFA 108 (90 BASE) MCG/ACT IN AERS Inhalation Inhale 1-2 puffs into the lungs every 6 (six) hours as needed for wheezing. 1 Inhaler 0  . ARIPIPRAZOLE 10 MG PO TABS Oral Take 5 mg by mouth daily.    Marland Kitchen DIPHENHYDRAMINE HCL 25 MG PO TABS Oral Take 50 mg by mouth every 6 (six) hours as needed. For sleep     . ATIVAN PO Oral Take 1 tablet by mouth 2 (two) times daily. Unknown dose       BP 130/94  Pulse 110  Temp 97.3 F (36.3 C)  Resp 18  SpO2 98%  Physical Exam  Nursing note and vitals reviewed. Constitutional: She is oriented to person, place, and time. She appears well-developed and well-nourished. No distress.  HENT:  Head: Normocephalic and atraumatic.  Nose: Nose normal.  Mouth/Throat: Uvula is midline, oropharynx is clear and moist and mucous membranes are normal.  Eyes: Pupils are equal, round, and reactive to light.  Neck: Normal range of motion.  Cardiovascular: Normal rate and intact distal pulses.   Pulmonary/Chest: Effort normal and breath sounds normal. No respiratory distress.  Abdominal:  Normal appearance. She exhibits no distension.  Musculoskeletal: Normal range of motion.  Neurological: She is alert and oriented to person, place, and time. No cranial nerve deficit.  Skin: Skin is warm and dry. Rash noted.     Psychiatric: She has a normal mood and affect. Her behavior is normal.    ED Course  Procedures (including critical care time)  DIAGNOSTIC STUDIES: Oxygen Saturation is 98% on room air, normal by my interpretation.    COORDINATION OF CARE:     7:06PM- EDP at bedside discusses treatment plan concerning examination of medical records and laboratory examinations.   Labs Reviewed - No data to display No results  found.   1. Adverse drug reaction       MDM  No drug drug interactions found between Abilify and Lamictal.  Lamictal has up to 10% adverse side effect of rash       I personally performed the services described in this documentation, which was scribed in my presence. The recorded information has been reviewed and considered.    Nelia Shi, MD 05/31/12 1919

## 2012-06-04 ENCOUNTER — Emergency Department (HOSPITAL_COMMUNITY)
Admission: EM | Admit: 2012-06-04 | Discharge: 2012-06-05 | Disposition: A | Discharge status: Home / Self Care | Payer: Medicare Other | Attending: Emergency Medicine | Admitting: Emergency Medicine

## 2012-06-04 DIAGNOSIS — F3289 Other specified depressive episodes: Secondary | ICD-10-CM | POA: Insufficient documentation

## 2012-06-04 DIAGNOSIS — F411 Generalized anxiety disorder: Secondary | ICD-10-CM | POA: Insufficient documentation

## 2012-06-04 DIAGNOSIS — IMO0001 Reserved for inherently not codable concepts without codable children: Secondary | ICD-10-CM | POA: Insufficient documentation

## 2012-06-04 DIAGNOSIS — F329 Major depressive disorder, single episode, unspecified: Secondary | ICD-10-CM | POA: Insufficient documentation

## 2012-06-05 LAB — BASIC METABOLIC PANEL
CO2: 24 mEq/L (ref 19–32)
Chloride: 101 mEq/L (ref 96–112)
GFR calc Af Amer: >90 mL/min (ref >90)
Potassium: 3.9 mEq/L (ref 3.5–5.1)

## 2012-06-05 LAB — RAPID URINE DRUG SCREEN, HOSP PERFORMED
Amphetamines: NOT DETECTED
Barbiturates: NOT DETECTED
Benzodiazepines: NOT DETECTED
Cocaine: NOT DETECTED
Tetrahydrocannabinol: NOT DETECTED

## 2012-06-05 LAB — CBC
HCT: 41.9 % (ref 36.0–46.0)
Hemoglobin: 15.1 g/dL — ABNORMAL HIGH (ref 12.0–15.0)
MCV: 88.2 fL (ref 78.0–100.0)
WBC: 13.8 10*3/uL — ABNORMAL HIGH (ref 4.0–10.5)

## 2012-06-05 LAB — POCT PREGNANCY, URINE: Preg Test, Ur: NEGATIVE

## 2012-06-05 LAB — ETHANOL: Alcohol, Ethyl (B): <11 mg/dL (ref 0–11)

## 2012-06-05 NOTE — ED Notes (Signed)
See downtime charting. 

## 2012-06-07 ENCOUNTER — Other Ambulatory Visit (HOSPITAL_COMMUNITY): Payer: No Typology Code available for payment source | Attending: Psychiatry

## 2012-06-07 ENCOUNTER — Encounter (HOSPITAL_COMMUNITY): Payer: Self-pay

## 2012-06-07 DIAGNOSIS — IMO0001 Reserved for inherently not codable concepts without codable children: Secondary | ICD-10-CM | POA: Insufficient documentation

## 2012-06-07 DIAGNOSIS — F3162 Bipolar disorder, current episode mixed, moderate: Secondary | ICD-10-CM

## 2012-06-07 DIAGNOSIS — Z79899 Other long term (current) drug therapy: Secondary | ICD-10-CM | POA: Insufficient documentation

## 2012-06-07 DIAGNOSIS — F3130 Bipolar disorder, current episode depressed, mild or moderate severity, unspecified: Secondary | ICD-10-CM | POA: Insufficient documentation

## 2012-06-07 NOTE — Progress Notes (Signed)
    Daily Group Progress Note  Program: IOP  Group Time: 9:00-10:30 am    Participation Level: Active  Behavioral Response: Appropriate  Type of Therapy:  Process Group  Summary of Progress: Patient was new to the group today. She reported feeing "uncomfortable" being in a group setting. She did not report any physical pain or appear to be in any physical discomfort. She was introduced, observed the group process and appeared attentive.      Group Time: 10:30 am - 12:00 pm   Participation Level:  Active  Behavioral Response: Appropriate  Type of Therapy: Psycho-education Group  Summary of Progress: Patient participated in a goodbye ceremony and discussed the importance of having closure.   Maxcine Ham, MSW, LCSW
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
## 2012-06-08 ENCOUNTER — Telehealth (HOSPITAL_COMMUNITY): Payer: Self-pay | Admitting: Psychiatry

## 2012-06-08 ENCOUNTER — Other Ambulatory Visit (HOSPITAL_COMMUNITY): Payer: No Typology Code available for payment source

## 2012-06-08 NOTE — Progress Notes (Signed)
Patient ID: Gwendolyn Green, female   DOB: [birth year], 46 y.o.   MRN: [id number] Patient did not show today and so was not seen.

## 2012-06-09 ENCOUNTER — Other Ambulatory Visit (HOSPITAL_COMMUNITY): Payer: No Typology Code available for payment source

## 2012-06-09 MED ORDER — ARIPIPRAZOLE 10 MG PO TABS: 5.0000 mg | ORAL_TABLET | Freq: Two times a day (BID) | ORAL | Status: DC

## 2012-06-09 MED ORDER — BUPROPION HCL ER (SR) 150 MG PO TB12: 150.0000 mg | ORAL_TABLET | Freq: Two times a day (BID) | ORAL | Status: DC
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
## 2012-06-12 ENCOUNTER — Other Ambulatory Visit (HOSPITAL_COMMUNITY): Payer: No Typology Code available for payment source

## 2012-06-13 ENCOUNTER — Other Ambulatory Visit (HOSPITAL_COMMUNITY): Payer: No Typology Code available for payment source

## 2012-06-13 NOTE — Progress Notes (Signed)
    Daily Group Progress Note  Program: IOP  Group Time: 9:00-10:30 am   Participation Level: Minimal  Behavioral Response: Appropriate  Type of Therapy:  Process Group  Summary of Progress: Patient reports feeling medium depression today. She only participates when called upon and appears to be observing the group members an facilitator. When she is asked a direct question, she provides an ambiguous answer instead of a strait forward response. She did not provide an update on her stressors today and states she is leaving the group today with more questions than answers.      Group Time: 10:30 am - 12:00 pm   Participation Level:  Active  Behavioral Response: Appropriate  Type of Therapy: Psycho-education Group  Summary of Progress: Patient participated in a discussion on boundaries and identified barriers to setting healthy boundaries with others due to different personality styles that interfere with limit setting.   Maxcine Ham, MSW, LCSW

## 2012-06-14 ENCOUNTER — Other Ambulatory Visit (HOSPITAL_COMMUNITY): Payer: No Typology Code available for payment source

## 2012-06-14 NOTE — Progress Notes (Signed)
    Daily Group Progress Note  Program: IOP  Group Time: 9:00-10:30 am   Participation Level: Minimal  Behavioral Response: Resistant  Type of Therapy:  Process Group  Summary of Progress: Patient appeared distant from the group again and appears to be outside the group,wheras the rest are connected. She only shares when called upon and then gives very short and ambiguous answers. She watches members and facilitator intently. She did not share today but made brief comments on how there is nothing she can do to get better and her only concern is her physical pain regarding her firbrodmialga. Patient is exploring if she is able to continue on in the group or if her physical pain is a barrier to her attendance.      Group Time: 10:30 am - 12:00 pm   Participation Level:  Minimal  Behavioral Response: Resistant  Type of Therapy: Psycho-education Group  Summary of Progress: Patient participated in part 2 of the boundary discussion from yesterday and learned the five ways to set healthy limits with others.    Maxcine Ham, MSW, LCSW

## 2012-06-15 ENCOUNTER — Telehealth (HOSPITAL_COMMUNITY): Payer: Self-pay | Admitting: Psychiatry

## 2012-06-15 ENCOUNTER — Other Ambulatory Visit (HOSPITAL_COMMUNITY): Payer: No Typology Code available for payment source | Attending: Psychiatry

## 2012-06-16 ENCOUNTER — Other Ambulatory Visit (HOSPITAL_COMMUNITY): Payer: No Typology Code available for payment source

## 2012-06-16 NOTE — Patient Instructions (Signed)
Patient will be discharged today due to medical issue.  Will follow up with Dr. Casimiro Needle at [facility]).  Provided patient with [facility] the [place] [phone]) information.  Another possibility for individual therapy would be through [facility].  Encouraged support groups.

## 2012-06-16 NOTE — Progress Notes (Signed)
Patient ID: Gwendolyn Green, female   DOB: Apr 29, 1966, 46 y.o.   MRN: [id number] D:  This is a 77 single caucasian female who was referred per assessment department, treatment for Bipolar Disorder.  Stressors:  1) Finances  2) Chronic Pain Issues.  Pt only attended [facility] for one week.  Missed two days due to Fibroymyalgia.  Pt decided not to return due to her chronic pain issues. Last day patient attended [facility], she denied any depression.  Reported feeling mildly anxious, denied any SI, slept eight hours the night before, fair appetite and concentration, with low energy.  A:  D/C today (through the mail).  [facility] [place]  Encouraged support groups.  Provided pt with [facility] [place] and [facility] phone numbers.  R:  Pt receptive.

## 2012-06-19 ENCOUNTER — Other Ambulatory Visit (HOSPITAL_COMMUNITY): Payer: No Typology Code available for payment source

## 2012-06-19 NOTE — Progress Notes (Incomplete)
  [facility] Intensive Outpatient Program Discharge Summary  Gwendolyn Green [id number]  Discharge Note  Patient:  Gwendolyn Green is an 46 y.o., female DOB:  04/05/1966  Date of Admission:  06-07-12  Date of Discharge:  06-16-12  Reason for Admission: Depression and anxiety, hypomanic.  Hospital Course: Patient was admitted because of hypomanic behaviors which included shopping sprees and spending all the money she had and then becoming depressed and irritable. Patient carried a diagnosis of bipolar disorder depressed with anxiety disorder NOS. During the hospitalization her Abilify was increased to 5 mg twice a day . and patient was started on Wellbutrin SR 150 twice a day. She was also continued on her lorazepam 1 mg twice a day. Patient tolerated the medications well but was very gave me during groups would sit there and refused to talk and share. When she was called upon and her behavior was presented to her patient decided she did not need to return back to IOP.  Mental Status at Discharge: As of 06-14-12. Patient was alert oriented x3, affect was appropriate mood was mildly irritable she stated she was in a great pain because of her fibromyalgia. Speech was mostly monosyllable like that no suicidal or homicidal ideation. No hallucinations or delusions. Recent and remote memory was good, judgment and insight were fair, concentration was fair recall was good.  Lab Results: No results found for this or any previous visit (from the past 48 hour(s)).  Current outpatient prescriptions:albuterol (PROVENTIL HFA;VENTOLIN HFA) 108 (90 BASE) MCG/ACT inhaler, Inhale 1-2 puffs into the lungs every 6 (six) hours as needed for wheezing., Disp: 1 Inhaler, Rfl: 0;  ARIPiprazole (ABILIFY) 10 MG tablet, Take 0.5 tablets (5 mg total) by mouth 2 (two) times daily., Disp: 60 tablet, Rfl: 0 buPROPion (WELLBUTRIN SR) 150 MG 12 hr tablet, Take 1 tablet (150 mg total) by mouth 2 (two) times daily., Disp: 60  tablet, Rfl: 0;  diphenhydrAMINE (BENADRYL) 25 MG tablet, Take 50 mg by mouth every 6 (six) hours as needed. For sleep , Disp: , Rfl: ;  LORazepam (ATIVAN PO), Take 1 tablet by mouth 2 (two) times daily. Unknown dose, Disp: , Rfl:   Axis Diagnosis:   Axis I: Anxiety Disorder NOS and Bipolar, Depressed Axis II: Cluster B Traits Axis III:  Past Medical History  Diagnosis Date  . Depressed   . Fibromyalgia   . Anxiety   . Bipolar disorder    Axis IV: economic problems, occupational problems, other psychosocial or environmental problems, problems related to social environment and problems with primary support group Axis V: 61-70 mild symptoms   Level of Care:  OP  Discharge destination:  Home  Is patient on multiple antipsychotic therapies at discharge:  No    Has Patient had three or more failed trials of antipsychotic monotherapy by history:  No  Patient phone:  [phone] (home)  Patient address:   [address] [place] [address],   Follow-up recommendations:  Activity:  As tolerated Diet:  Regular Other:  Fallot with Dr. Casimiro Needle at the [facility] who she sees on a regular basis and she'll [facility] [place] [facility].  Comments:  At the time of discharge patient was not a suicidal or homicidal risk.  The patient received suicide prevention pamphlet:  No Belongings returned:    Margit Banda 06/19/2012, 1:48 PM   [facility] 06/19/2012

## 2012-06-20 ENCOUNTER — Other Ambulatory Visit (HOSPITAL_COMMUNITY): Payer: No Typology Code available for payment source

## 2012-06-21 ENCOUNTER — Other Ambulatory Visit (HOSPITAL_COMMUNITY): Payer: No Typology Code available for payment source

## 2012-06-22 ENCOUNTER — Other Ambulatory Visit (HOSPITAL_COMMUNITY): Payer: No Typology Code available for payment source

## 2012-06-23 ENCOUNTER — Other Ambulatory Visit (HOSPITAL_COMMUNITY): Payer: No Typology Code available for payment source

## 2012-06-26 ENCOUNTER — Other Ambulatory Visit (HOSPITAL_COMMUNITY): Payer: No Typology Code available for payment source

## 2012-06-27 ENCOUNTER — Other Ambulatory Visit (HOSPITAL_COMMUNITY): Payer: No Typology Code available for payment source

## 2012-06-28 ENCOUNTER — Other Ambulatory Visit (HOSPITAL_COMMUNITY): Payer: No Typology Code available for payment source

## 2013-03-12 ENCOUNTER — Encounter (HOSPITAL_COMMUNITY): Payer: Self-pay | Admitting: *Deleted

## 2013-03-12 ENCOUNTER — Emergency Department (HOSPITAL_COMMUNITY)
Admission: EM | Admit: 2013-03-12 | Discharge: 2013-03-12 | Disposition: A | Discharge status: Home / Self Care | Payer: Medicare Other | Attending: Emergency Medicine | Admitting: Emergency Medicine

## 2013-03-12 DIAGNOSIS — F319 Bipolar disorder, unspecified: Secondary | ICD-10-CM | POA: Insufficient documentation

## 2013-03-12 DIAGNOSIS — E669 Obesity, unspecified: Secondary | ICD-10-CM | POA: Insufficient documentation

## 2013-03-12 DIAGNOSIS — H539 Unspecified visual disturbance: Secondary | ICD-10-CM | POA: Insufficient documentation

## 2013-03-12 DIAGNOSIS — R632 Polyphagia: Secondary | ICD-10-CM | POA: Insufficient documentation

## 2013-03-12 DIAGNOSIS — IMO0001 Reserved for inherently not codable concepts without codable children: Secondary | ICD-10-CM | POA: Insufficient documentation

## 2013-03-12 DIAGNOSIS — R631 Polydipsia: Secondary | ICD-10-CM | POA: Insufficient documentation

## 2013-03-12 DIAGNOSIS — E1169 Type 2 diabetes mellitus with other specified complication: Secondary | ICD-10-CM | POA: Insufficient documentation

## 2013-03-12 DIAGNOSIS — R000 Tachycardia, unspecified: Secondary | ICD-10-CM | POA: Insufficient documentation

## 2013-03-12 DIAGNOSIS — F411 Generalized anxiety disorder: Secondary | ICD-10-CM | POA: Insufficient documentation

## 2013-03-12 DIAGNOSIS — Z3202 Encounter for pregnancy test, result negative: Secondary | ICD-10-CM | POA: Insufficient documentation

## 2013-03-12 DIAGNOSIS — Z87891 Personal history of nicotine dependence: Secondary | ICD-10-CM | POA: Insufficient documentation

## 2013-03-12 DIAGNOSIS — R5383 Other fatigue: Secondary | ICD-10-CM | POA: Insufficient documentation

## 2013-03-12 DIAGNOSIS — R358 Other polyuria: Secondary | ICD-10-CM | POA: Insufficient documentation

## 2013-03-12 DIAGNOSIS — Z79899 Other long term (current) drug therapy: Secondary | ICD-10-CM | POA: Insufficient documentation

## 2013-03-12 DIAGNOSIS — E119 Type 2 diabetes mellitus without complications: Secondary | ICD-10-CM

## 2013-03-12 DIAGNOSIS — R3589 Other polyuria: Secondary | ICD-10-CM | POA: Insufficient documentation

## 2013-03-12 DIAGNOSIS — R5381 Other malaise: Secondary | ICD-10-CM | POA: Insufficient documentation

## 2013-03-12 LAB — GLUCOSE, CAPILLARY
Glucose-Capillary: 354 mg/dL — ABNORMAL HIGH (ref 70–99)
Glucose-Capillary: 310 mg/dL — ABNORMAL HIGH (ref 70–99)
Glucose-Capillary: 193 mg/dL — ABNORMAL HIGH (ref 70–99)

## 2013-03-12 LAB — COMPREHENSIVE METABOLIC PANEL
Alkaline Phosphatase: 101 U/L (ref 39–117)
BUN: 10 mg/dL (ref 6–23)
CO2: 28 mEq/L (ref 19–32)
Chloride: 92 mEq/L — ABNORMAL LOW (ref 96–112)
Glucose, Bld: 357 mg/dL — ABNORMAL HIGH (ref 70–99)
Potassium: 4.2 mEq/L (ref 3.5–5.1)
Total Bilirubin: 0.5 mg/dL (ref 0.3–1.2)

## 2013-03-12 LAB — COMPREHENSIVE METABOLIC PANEL WITH GFR
ALT: 44 U/L — ABNORMAL HIGH (ref 0–35)
AST: 46 U/L — ABNORMAL HIGH (ref 0–37)
Albumin: 4 g/dL (ref 3.5–5.2)
Calcium: 9.9 mg/dL (ref 8.4–10.5)
Creatinine, Ser: 0.68 mg/dL (ref 0.50–1.10)
GFR calc Af Amer: >90 mL/min (ref >90)
GFR calc non Af Amer: >90 mL/min (ref >90)
Sodium: 133 meq/L — ABNORMAL LOW (ref 135–145)
Total Protein: 7.6 g/dL (ref 6.0–8.3)

## 2013-03-12 LAB — URINALYSIS, ROUTINE W REFLEX MICROSCOPIC
Bilirubin Urine: NEGATIVE
Glucose, UA: >1000 mg/dL — AB
Hgb urine dipstick: NEGATIVE
Ketones, ur: 15 mg/dL — AB
Leukocytes, UA: NEGATIVE
Nitrite: NEGATIVE
Protein, ur: NEGATIVE mg/dL
Specific Gravity, Urine: 1.042 — ABNORMAL HIGH (ref 1.005–1.030)
Urobilinogen, UA: 0.2 mg/dL (ref 0.0–1.0)
pH: 6 (ref 5.0–8.0)

## 2013-03-12 LAB — CBC
HCT: 44.1 % (ref 36.0–46.0)
Hemoglobin: 16.2 g/dL — ABNORMAL HIGH (ref 12.0–15.0)
MCH: 31.8 pg (ref 26.0–34.0)
MCHC: 36.7 g/dL — ABNORMAL HIGH (ref 30.0–36.0)
MCV: 86.6 fL (ref 78.0–100.0)
Platelets: 237 K/uL (ref 150–400)
RBC: 5.09 MIL/uL (ref 3.87–5.11)
RDW: 12 % (ref 11.5–15.5)
WBC: 13.3 10*3/uL — ABNORMAL HIGH (ref 4.0–10.5)

## 2013-03-12 LAB — URINE MICROSCOPIC-ADD ON

## 2013-03-12 LAB — POCT PREGNANCY, URINE: Preg Test, Ur: NEGATIVE

## 2013-03-12 MED ORDER — METFORMIN HCL 850 MG PO TABS: 850.0000 mg | ORAL_TABLET | Freq: Every day | ORAL | Status: DC

## 2013-03-12 MED ORDER — SODIUM CHLORIDE 0.9 % IV BOLUS (SEPSIS)
1000.0000 mL | Freq: Once | INTRAVENOUS | Status: AC
  Administered 2013-03-12: 1000 mL via INTRAVENOUS

## 2013-03-12 MED ORDER — INSULIN ASPART 100 UNIT/ML ~~LOC~~ SOLN
8.0000 [IU] | Freq: Once | SUBCUTANEOUS | Status: AC
  Administered 2013-03-12: 8 [IU] via SUBCUTANEOUS
  Filled 2013-03-12: qty 1

## 2013-03-12 NOTE — ED Notes (Signed)
Pt reports increased thirst and states that over the last 4 days has had intermittent blurry vision, but states woke up this am with blurry vision that was worse.  Pt states her blood sugar came back at 435 at a neighbors house.  She has no history of diabetes..  No steroids.

## 2013-03-12 NOTE — ED Notes (Signed)
Pt reports polyuria, thirst, and dizziness x 4 days. Took CBG at friend's house and found it to be 435. Came to the ER for further evaluation. Does not have a history of Diabetes or is on steroid treatment. Pt has a chronic history of elevated WBC count of unknown origin.

## 2013-03-12 NOTE — Discharge Instructions (Signed)
Diabetes, Type 2 Diabetes is a long-lasting (chronic) disease. In type 2 diabetes, the pancreas does not make enough insulin (a hormone), and the body does not respond normally to the insulin that is made. This type of diabetes was also previously called adult-onset diabetes. It usually occurs after the age of 40, but it can occur at any age.  CAUSES  Type 2 diabetes happens because the pancreasis not making enough insulin or your body has trouble using the insulin that your pancreas does make properly. SYMPTOMS   Drinking more than usual.  Urinating more than usual.  Blurred vision.  Dry, itchy skin.  Frequent infections.  Feeling more tired than usual (fatigue). DIAGNOSIS The diagnosis of type 2 diabetes is usually made by one of the following tests:  Fasting blood glucose test. You will not eat for at least 8 hours and then take a blood test.  Random blood glucose test. Your blood glucose (sugar) is checked at any time of the day regardless of when you ate.  Oral glucose tolerance test (OGTT). Your blood glucose is measured after you have not eaten (fasted) and then after you drink a glucose containing beverage. TREATMENT   Healthy eating.  Exercise.  Medicine, if needed.  Monitoring blood glucose.  Seeing your caregiver regularly. HOME CARE INSTRUCTIONS   Check your blood glucose at least once a day. More frequent monitoring may be necessary, depending on your medicines and on how well your diabetes is controlled. Your caregiver will advise you.  Take your medicine as directed by your caregiver.  Do not smoke.  Make wise food choices. Ask your caregiver for information. Weight loss can improve your diabetes.  Learn about low blood glucose (hypoglycemia) and how to treat it.  Get your eyes checked regularly.  Have a yearly physical exam. Have your blood pressure checked and your blood and urine tested.  Wear a pendant or bracelet saying that you have  diabetes.  Check your feet every night for cuts, sores, blisters, and redness. Let your caregiver know if you have any problems. SEEK MEDICAL CARE IF:   You have problems keeping your blood glucose in target range.  You have problems with your medicines.  You have symptoms of an illness that do not improve after 24 hours.  You have a sore or wound that is not healing.  You notice a change in vision or a new problem with your vision.  You have a fever. MAKE SURE YOU:  Understand these instructions.  Will watch your condition.  Will get help right away if you are not doing well or get worse. Document Released: 11/01/2005 Document Revised: 01/24/2012 Document Reviewed: 04/19/2011 Exit[employer] Patient Information 2013 [employer].  

## 2013-03-12 NOTE — ED Provider Notes (Signed)
History     CSN: [id number]  Arrival date & time 03/12/13  1138   First MD Initiated Contact with Patient 03/12/13 1657      Chief Complaint  Patient presents with  . Hyperglycemia    (Consider location/radiation/quality/duration/timing/severity/associated sxs/prior treatment) HPI Comments: Patient with no prior history of diabetes reports several week history of fatigue, malaise, blurred vision and feeling dehydrated with increased urinary output. Apparently an Accu-Chek performed at a neighbors house revealed a blood sugar of approximately 400. Patient has significant history of obesity, bipolar disorder along with depression and anxiety. She reports she was started on a new medication through the [place] recently but does not recall the name of it. She denies any chest pain, abdominal pain, nausea or vomiting. No specific medications to address her symptoms were taken prior to arrival.  The history is provided by the patient.    Past Medical History  Diagnosis Date  . Depressed   . Fibromyalgia   . Anxiety   . Bipolar disorder     Past Surgical History  Procedure Laterality Date  . Cholecystectomy      Family History  Problem Relation Age of Onset  . Bipolar disorder Mother   . Alcohol abuse Father     History  Substance Use Topics  . Smoking status: Former Games developer  . Smokeless tobacco: Not on file  . Alcohol Use: No    OB History   Grav Para Term Preterm Abortions TAB SAB Ect Mult Living                  Review of Systems  Constitutional: Positive for fatigue. Negative for fever, appetite change and unexpected weight change.  Eyes: Positive for visual disturbance.  Respiratory: Negative for chest tightness and shortness of breath.   Cardiovascular: Negative for chest pain.  Gastrointestinal: Negative for nausea, vomiting and abdominal pain.  Endocrine: Positive for polydipsia, polyphagia and polyuria.  Neurological: Positive for weakness.  All other systems  reviewed and are negative.    Allergies  Lamictal  Home Medications   Current Outpatient Rx  Name  Route  Sig  Dispense  Refill  . busPIRone (BUSPAR) 15 MG tablet   Oral   Take 15 mg by mouth 3 (three) times daily.         . cyclobenzaprine (FLEXERIL) 10 MG tablet   Oral   Take 10 mg by mouth 3 (three) times daily as needed for muscle spasms.         . diphenhydrAMINE (BENADRYL) 25 MG tablet   Oral   Take 50 mg by mouth at bedtime. For allergies, sleep         . gabapentin (NEURONTIN) 400 MG capsule   Oral   Take 400 mg by mouth 3 (three) times daily.         Marland Kitchen HYDROXYZINE PAMOATE PO   Oral   Take 25 mg by mouth daily.         Marland Kitchen LORazepam (ATIVAN) 2 MG tablet   Oral   Take 2 mg by mouth every 8 (eight) hours as needed for anxiety.         Marland Kitchen lurasidone (LATUDA) 40 MG TABS   Oral   Take 120 mg by mouth daily with breakfast.         . metFORMIN (GLUCOPHAGE) 850 MG tablet   Oral   Take 1 tablet (850 mg total) by mouth daily.   14 tablet   0  BP 135/96  Pulse 97  Temp(Src) 97.8 F (36.6 C) (Oral)  Resp 16  SpO2 94%  LMP 02/02/2013  Physical Exam  Nursing note and vitals reviewed. Constitutional: She is oriented to person, place, and time. She appears well-developed and well-nourished. No distress.  HENT:  Head: Normocephalic and atraumatic.  Eyes: Conjunctivae and EOM are normal. Pupils are equal, round, and reactive to light. No scleral icterus.  Neck: Normal range of motion. Neck supple.  Cardiovascular: Regular rhythm and intact distal pulses.  Tachycardia present.   No murmur heard. Pulmonary/Chest: Effort normal. No respiratory distress.  Abdominal: Soft. She exhibits no distension. There is no tenderness.  Musculoskeletal: Normal range of motion.  Neurological: She is alert and oriented to person, place, and time. She exhibits normal muscle tone. Coordination normal.  Skin: Skin is warm and dry. No rash noted. She is not  diaphoretic.    ED Course  Procedures (including critical care time)  Labs Reviewed  CBC - Abnormal; Notable for the following:    WBC 13.3 (*)    Hemoglobin 16.2 (*)    MCHC 36.7 (*)    All other components within normal limits  COMPREHENSIVE METABOLIC PANEL - Abnormal; Notable for the following:    Sodium 133 (*)    Chloride 92 (*)    Glucose, Bld 357 (*)    AST 46 (*)    ALT 44 (*)    All other components within normal limits  URINALYSIS, ROUTINE W REFLEX MICROSCOPIC - Abnormal; Notable for the following:    Specific Gravity, Urine 1.042 (*)    Glucose, UA >1000 (*)    Ketones, ur 15 (*)    All other components within normal limits  GLUCOSE, CAPILLARY - Abnormal; Notable for the following:    Glucose-Capillary 354 (*)    All other components within normal limits  GLUCOSE, CAPILLARY - Abnormal; Notable for the following:    Glucose-Capillary 310 (*)    All other components within normal limits  GLUCOSE, CAPILLARY - Abnormal; Notable for the following:    Glucose-Capillary 193 (*)    All other components within normal limits  URINE MICROSCOPIC-ADD ON  POCT PREGNANCY, URINE   No results found.   1. Diabetes mellitus     Room air saturation is 96% I interpret this to be normal    8:26 PM Recheck of glucose is down to 193, will discharge to home.  Pt knows to follow up with PCP at [facility] in 1 week. MDM   Patient is tachycardic here, mild appearance of dehydration. Blood sugar here is greater than 300, likely new onset diabetes. In review of prior records, likely the patient has had borderline diabetes in the past, but was never overtly diagnosed to her primary care physicians. Patient's anion gap is 13 with a normal bicarbonate. I do not believe the patient is in DKA. I suspect her symptoms are simply do to dehydration and hyperglycemia. Plan is to give her some insulin and 2 L of IV fluids, will reassess her blood sugar. Uncomfortable starting her on a low-dose metformin  and the patient can followup at the [facility] for further management of likely adult-onset diabetes.        Gavin Pound. Oletta Lamas, MD 03/12/13 2026

## 2013-03-12 NOTE — ED Notes (Signed)
States she is feeling better, and her headache is better

## 2014-10-29 ENCOUNTER — Encounter (HOSPITAL_COMMUNITY): Payer: Self-pay | Admitting: Emergency Medicine

## 2014-10-29 ENCOUNTER — Emergency Department (HOSPITAL_COMMUNITY): Payer: Medicare Other

## 2014-10-29 ENCOUNTER — Emergency Department (HOSPITAL_COMMUNITY)
Admission: EM | Admit: 2014-10-29 | Discharge: 2014-10-29 | Disposition: A | Discharge status: Home / Self Care | Payer: Medicare Other | Attending: Emergency Medicine | Admitting: Emergency Medicine

## 2014-10-29 DIAGNOSIS — F419 Anxiety disorder, unspecified: Secondary | ICD-10-CM | POA: Insufficient documentation

## 2014-10-29 DIAGNOSIS — F319 Bipolar disorder, unspecified: Secondary | ICD-10-CM | POA: Diagnosis not present

## 2014-10-29 DIAGNOSIS — Z79899 Other long term (current) drug therapy: Secondary | ICD-10-CM | POA: Insufficient documentation

## 2014-10-29 DIAGNOSIS — Z87891 Personal history of nicotine dependence: Secondary | ICD-10-CM | POA: Diagnosis not present

## 2014-10-29 DIAGNOSIS — R000 Tachycardia, unspecified: Secondary | ICD-10-CM | POA: Diagnosis not present

## 2014-10-29 DIAGNOSIS — R079 Chest pain, unspecified: Secondary | ICD-10-CM | POA: Diagnosis present

## 2014-10-29 DIAGNOSIS — R002 Palpitations: Secondary | ICD-10-CM | POA: Diagnosis not present

## 2014-10-29 DIAGNOSIS — M797 Fibromyalgia: Secondary | ICD-10-CM | POA: Diagnosis not present

## 2014-10-29 LAB — CBC
HEMATOCRIT: 39.4 % (ref 36.0–46.0)
HEMOGLOBIN: 13.5 g/dL (ref 12.0–15.0)
MCH: 29.9 pg (ref 26.0–34.0)
MCHC: 34.3 g/dL (ref 30.0–36.0)
MCV: 87.4 fL (ref 78.0–100.0)
Platelets: 298 10*3/uL (ref 150–400)
RBC: 4.51 MIL/uL (ref 3.87–5.11)
RDW: 13 % (ref 11.5–15.5)
WBC: 13.3 10*3/uL — AB (ref 4.0–10.5)

## 2014-10-29 LAB — D-DIMER, QUANTITATIVE: D-Dimer, Quant: 0.32 ug/mL-FEU (ref 0.00–0.48)

## 2014-10-29 LAB — I-STAT TROPONIN, ED
Troponin i, poc: 0 ng/mL (ref 0.00–0.08)
Troponin i, poc: 0 ng/mL (ref 0.00–0.08)

## 2014-10-29 LAB — COMPREHENSIVE METABOLIC PANEL
ALT: 27 U/L (ref 0–35)
ANION GAP: 11 (ref 5–15)
AST: 15 U/L (ref 0–37)
Albumin: 3.4 g/dL — ABNORMAL LOW (ref 3.5–5.2)
Alkaline Phosphatase: 79 U/L (ref 39–117)
BILIRUBIN TOTAL: 0.3 mg/dL (ref 0.3–1.2)
BUN: 12 mg/dL (ref 6–23)
CHLORIDE: 100 meq/L (ref 96–112)
CO2: 30 meq/L (ref 19–32)
CREATININE: 0.75 mg/dL (ref 0.50–1.10)
Calcium: 9.7 mg/dL (ref 8.4–10.5)
GFR calc Af Amer: >90 mL/min (ref >90)
GLUCOSE: 182 mg/dL — AB (ref 70–99)
Potassium: 3.9 mEq/L (ref 3.7–5.3)
Sodium: 141 mEq/L (ref 137–147)
Total Protein: 6.8 g/dL (ref 6.0–8.3)

## 2014-10-29 LAB — APTT: aPTT: 33 seconds (ref 24–37)

## 2014-10-29 LAB — PROTIME-INR
INR: 0.96 (ref 0.00–1.49)
PROTHROMBIN TIME: 12.9 s (ref 11.6–15.2)

## 2014-10-29 MED ORDER — ASPIRIN 81 MG PO CHEW
324.0000 mg | CHEWABLE_TABLET | Freq: Once | ORAL | Status: AC
  Administered 2014-10-29: 324 mg via ORAL
  Filled 2014-10-29: qty 4

## 2014-10-29 MED ORDER — SODIUM CHLORIDE 0.9 % IV SOLN
1000.0000 mL | INTRAVENOUS | Status: DC
  Administered 2014-10-29: 1000 mL via INTRAVENOUS

## 2014-10-29 NOTE — ED Notes (Signed)
Patient transported to X-ray 

## 2014-10-29 NOTE — ED Notes (Signed)
Pt states she woke up feeling palpitation, CP 6/10 on her right chest and mid chest and some SOB, pt denies fever, chills, nausea, vomiting or dizziness.

## 2014-10-29 NOTE — ED Provider Notes (Signed)
CSN: [id number]     Arrival date & time 10/29/14  0448 History   First MD Initiated Contact with Patient 10/29/14 0501     Chief Complaint  Patient presents with  . Chest Pain  . Palpitations   HPI The patient woke up about an hour before arrival to the emergency room with a sensation that her heart was racing. The patient has a history of tachycardia but usually does not ever notice it. She woke up feeling that her heart was racing. Had some discomfort in the center and right side of her chest. The little bit short of breath as well. She denies any trouble with fevers, chills, nausea, vomiting, or leg swelling. Symptoms have been improving however they are not completely resolved. She denies any history of heart disease. No history of blood clots. Past Medical History  Diagnosis Date  . Depressed   . Fibromyalgia   . Anxiety   . Bipolar disorder    Past Surgical History  Procedure Laterality Date  . Cholecystectomy     Family History  Problem Relation Age of Onset  . Bipolar disorder Mother   . Alcohol abuse Father    History  Substance Use Topics  . Smoking status: Former Games developermoker  . Smokeless tobacco: Former NeurosurgeonUser  . Alcohol Use: No   OB History    No data available     Review of Systems  All other systems reviewed and are negative.     Allergies  Lamictal  Home Medications   Prior to Admission medications   Medication Sig Start Date End Date Taking? Authorizing Provider  clonazePAM (KLONOPIN) 1 MG tablet Take 1 mg by mouth 4 (four) times daily as needed for anxiety.   Yes Historical Provider, MD  cyclobenzaprine (FLEXERIL) 10 MG tablet Take 10 mg by mouth 3 (three) times daily as needed for muscle spasms.   Yes Historical Provider, MD  Chilton SiGreen Coffee Bean 400 MG CAPS Take 800 mg by mouth 2 (two) times daily.   Yes Historical Provider, MD  Lactobacillus (ACIDOPHILUS PO) Take 1 capsule by mouth at bedtime.   Yes Historical Provider, MD  lurasidone (LATUDA) 40 MG TABS  Take 80 mg by mouth daily with breakfast.    Yes Historical Provider, MD  metFORMIN (GLUCOPHAGE) 1000 MG tablet Take 1,000 mg by mouth 2 (two) times daily with a meal.   Yes Historical Provider, MD  OVER THE COUNTER MEDICATION Take 1 tablet by mouth 2 (two) times daily. Triple-tea fat burner   Yes Historical Provider, MD  PARoxetine (PAXIL) 40 MG tablet Take 20 mg by mouth every morning.   Yes Historical Provider, MD  ranitidine (ZANTAC) 150 MG tablet Take 150 mg by mouth 2 (two) times daily as needed for heartburn.   Yes Historical Provider, MD  busPIRone (BUSPAR) 15 MG tablet Take 15 mg by mouth 3 (three) times daily.    Historical Provider, MD  diphenhydrAMINE (BENADRYL) 25 MG tablet Take 50 mg by mouth at bedtime. For allergies, sleep    Historical Provider, MD  gabapentin (NEURONTIN) 400 MG capsule Take 400 mg by mouth 3 (three) times daily.    Historical Provider, MD  HYDROXYZINE PAMOATE PO Take 25 mg by mouth daily.    Historical Provider, MD  LORazepam (ATIVAN) 2 MG tablet Take 2 mg by mouth every 8 (eight) hours as needed for anxiety.    Historical Provider, MD  metFORMIN (GLUCOPHAGE) 850 MG tablet Take 1 tablet (850 mg total) by mouth daily.  Patient not taking: Reported on 10/29/2014 03/12/13   Gavin PoundMichael Y. Ghim, MD   BP 108/74 mmHg  Pulse 104  Resp 15  Ht 5' (1.524 m)  Wt 200 lb (90.719 kg)  BMI 39.06 kg/m2  SpO2 96%  LMP 10/15/2014 Physical Exam  Constitutional: She appears well-developed and well-nourished. No distress.  HENT:  Head: Normocephalic and atraumatic.  Right Ear: External ear normal.  Left Ear: External ear normal.  Eyes: Conjunctivae are normal. Right eye exhibits no discharge. Left eye exhibits no discharge. No scleral icterus.  Neck: Neck supple. No tracheal deviation present.  Cardiovascular: Regular rhythm and intact distal pulses.  Tachycardia present.   Pulmonary/Chest: Effort normal and breath sounds normal. No stridor. No respiratory distress. She has no  wheezes. She has no rales.  Abdominal: Soft. Bowel sounds are normal. She exhibits no distension. There is no tenderness. There is no rebound and no guarding.  Musculoskeletal: She exhibits no edema or tenderness.  Neurological: She is alert. She has normal strength. No cranial nerve deficit (no facial droop, extraocular movements intact, no slurred speech) or sensory deficit. She exhibits normal muscle tone. She displays no seizure activity. Coordination normal.  Skin: Skin is warm and dry. No rash noted.  Psychiatric: She has a normal mood and affect.  Nursing note and vitals reviewed.   ED Course  Procedures (including critical care time) Labs Review Labs Reviewed  CBC - Abnormal; Notable for the following:    WBC 13.3 (*)    All other components within normal limits  COMPREHENSIVE METABOLIC PANEL - Abnormal; Notable for the following:    Glucose, Bld 182 (*)    Albumin 3.4 (*)    All other components within normal limits  APTT  PROTIME-INR  D-DIMER, QUANTITATIVE  I-STAT TROPOININ, ED    Imaging Review Dg Chest 2 View  10/29/2014   CLINICAL DATA:  Chest pain, palpitations, initial encounter  EXAM: CHEST  2 VIEW  COMPARISON:  11/24/2011  FINDINGS: The heart size and mediastinal contours are within normal limits. Both lungs are clear. The visualized skeletal structures are unremarkable.  IMPRESSION: No active cardiopulmonary disease.   Electronically Signed   By: Elige KoHetal  Patel   On: 10/29/2014 07:18     EKG Interpretation   Date/Time:  Tuesday October 29 2014 04:54:23 EST Ventricular Rate:  106 PR Interval:  152 QRS Duration: 87 QT Interval:  369 QTC Calculation: 490 R Axis:   94 Text Interpretation:  Sinus tachycardia Borderline right axis deviation  Low voltage, precordial leads Borderline prolonged QT interval Baseline  wander in lead(s) III V1 V5 No significant change since last tracing  Confirmed by Trequan Marsolek  MD-J, Laquan Ludden ([id number]) on 10/29/2014 4:58:58 AM      MDM    Pt presents with palpitations and chest pain.  Initial troponin negative.  Sx not typical for ACS.  Heart score 3.  Will repeat 2nd troponin.  If negative, will have pt follow up with PCP as an outpatient.    Linwood DibblesJon Daquarius Dubeau, MD 10/29/14 [phone]

## 2014-10-29 NOTE — Discharge Instructions (Signed)
Follow up with your family md next week. °

## 2014-12-14 ENCOUNTER — Encounter (HOSPITAL_COMMUNITY): Payer: Self-pay | Admitting: *Deleted

## 2014-12-14 ENCOUNTER — Observation Stay (HOSPITAL_COMMUNITY)
Admission: EM | Admit: 2014-12-14 | Discharge: 2014-12-15 | Disposition: A | Discharge status: Home / Self Care | Payer: Medicare Other | Attending: Internal Medicine | Admitting: Internal Medicine

## 2014-12-14 ENCOUNTER — Emergency Department (HOSPITAL_COMMUNITY): Payer: Medicare Other

## 2014-12-14 DIAGNOSIS — F319 Bipolar disorder, unspecified: Secondary | ICD-10-CM | POA: Diagnosis not present

## 2014-12-14 DIAGNOSIS — F329 Major depressive disorder, single episode, unspecified: Secondary | ICD-10-CM

## 2014-12-14 DIAGNOSIS — R079 Chest pain, unspecified: Secondary | ICD-10-CM | POA: Diagnosis present

## 2014-12-14 DIAGNOSIS — Z79899 Other long term (current) drug therapy: Secondary | ICD-10-CM | POA: Insufficient documentation

## 2014-12-14 DIAGNOSIS — Z9049 Acquired absence of other specified parts of digestive tract: Secondary | ICD-10-CM | POA: Insufficient documentation

## 2014-12-14 DIAGNOSIS — Z87891 Personal history of nicotine dependence: Secondary | ICD-10-CM | POA: Diagnosis not present

## 2014-12-14 DIAGNOSIS — M797 Fibromyalgia: Secondary | ICD-10-CM | POA: Diagnosis not present

## 2014-12-14 DIAGNOSIS — R000 Tachycardia, unspecified: Secondary | ICD-10-CM

## 2014-12-14 DIAGNOSIS — F419 Anxiety disorder, unspecified: Secondary | ICD-10-CM

## 2014-12-14 DIAGNOSIS — R072 Precordial pain: Secondary | ICD-10-CM

## 2014-12-14 DIAGNOSIS — E119 Type 2 diabetes mellitus without complications: Secondary | ICD-10-CM

## 2014-12-14 DIAGNOSIS — F32A Depression, unspecified: Secondary | ICD-10-CM | POA: Diagnosis present

## 2014-12-14 LAB — CBC
HCT: 38.6 % (ref 36.0–46.0)
Hemoglobin: 13.3 g/dL (ref 12.0–15.0)
MCH: 30.2 pg (ref 26.0–34.0)
MCHC: 34.5 g/dL (ref 30.0–36.0)
MCV: 87.5 fL (ref 78.0–100.0)
Platelets: 269 10*3/uL (ref 150–400)
RBC: 4.41 MIL/uL (ref 3.87–5.11)
RDW: 13.2 % (ref 11.5–15.5)
WBC: 10.9 10*3/uL — ABNORMAL HIGH (ref 4.0–10.5)

## 2014-12-14 LAB — BASIC METABOLIC PANEL
ANION GAP: 6 (ref 5–15)
BUN: 9 mg/dL (ref 6–23)
CHLORIDE: 104 mmol/L (ref 96–112)
CO2: 27 mmol/L (ref 19–32)
Calcium: 9.6 mg/dL (ref 8.4–10.5)
Creatinine, Ser: 0.83 mg/dL (ref 0.50–1.10)
GFR calc Af Amer: >90 mL/min (ref >90)
GFR calc non Af Amer: 83 mL/min — ABNORMAL LOW (ref >90)
GLUCOSE: 90 mg/dL (ref 70–99)
Potassium: 4.2 mmol/L (ref 3.5–5.1)
Sodium: 137 mmol/L (ref 135–145)

## 2014-12-14 LAB — TROPONIN I: Troponin I: <0.03 ng/mL (ref <0.031)

## 2014-12-14 LAB — GLUCOSE, CAPILLARY: Glucose-Capillary: 102 mg/dL — ABNORMAL HIGH (ref 70–99)

## 2014-12-14 MED ORDER — INSULIN ASPART 100 UNIT/ML ~~LOC~~ SOLN
0.0000 [IU] | Freq: Three times a day (TID) | SUBCUTANEOUS | Status: DC
  Administered 2014-12-15: 1 [IU] via SUBCUTANEOUS

## 2014-12-14 MED ORDER — SODIUM CHLORIDE 0.9 % IV SOLN
INTRAVENOUS | Status: AC
  Administered 2014-12-14: 23:00:00 via INTRAVENOUS

## 2014-12-14 MED ORDER — CLONAZEPAM 1 MG PO TABS: 1.0000 mg | ORAL_TABLET | Freq: Four times a day (QID) | ORAL | Status: DC | PRN

## 2014-12-14 MED ORDER — CYCLOBENZAPRINE HCL 10 MG PO TABS: 10.0000 mg | ORAL_TABLET | Freq: Three times a day (TID) | ORAL | Status: DC | PRN

## 2014-12-14 MED ORDER — ENOXAPARIN SODIUM 40 MG/0.4ML ~~LOC~~ SOLN
40.0000 mg | SUBCUTANEOUS | Status: DC
  Administered 2014-12-14: 40 mg via SUBCUTANEOUS
  Filled 2014-12-14: qty 0.4

## 2014-12-14 MED ORDER — ONDANSETRON HCL 4 MG/2ML IJ SOLN
4.0000 mg | Freq: Four times a day (QID) | INTRAMUSCULAR | Status: DC | PRN
  Filled 2014-12-14: qty 2

## 2014-12-14 MED ORDER — LURASIDONE HCL 80 MG PO TABS
80.0000 mg | ORAL_TABLET | Freq: Every day | ORAL | Status: DC
  Filled 2014-12-14 (×2): qty 1

## 2014-12-14 MED ORDER — MORPHINE SULFATE 2 MG/ML IJ SOLN: 2.0000 mg | INTRAMUSCULAR | Status: DC | PRN

## 2014-12-14 MED ORDER — DIPHENHYDRAMINE HCL 25 MG PO TABS: 50.0000 mg | ORAL_TABLET | Freq: Every evening | ORAL | Status: DC | PRN

## 2014-12-14 MED ORDER — ASPIRIN 81 MG PO CHEW
324.0000 mg | CHEWABLE_TABLET | Freq: Once | ORAL | Status: AC
  Administered 2014-12-14: 324 mg via ORAL
  Filled 2014-12-14: qty 4

## 2014-12-14 MED ORDER — INSULIN ASPART 100 UNIT/ML ~~LOC~~ SOLN: 0.0000 [IU] | Freq: Every day | SUBCUTANEOUS | Status: DC

## 2014-12-14 MED ORDER — ACETAMINOPHEN 325 MG PO TABS
650.0000 mg | ORAL_TABLET | ORAL | Status: DC | PRN
  Administered 2014-12-14 – 2014-12-15 (×2): 650 mg via ORAL
  Filled 2014-12-14 (×2): qty 2

## 2014-12-14 MED ORDER — NITROGLYCERIN 0.4 MG SL SUBL
0.4000 mg | SUBLINGUAL_TABLET | SUBLINGUAL | Status: DC | PRN
  Administered 2014-12-14: 0.4 mg via SUBLINGUAL
  Filled 2014-12-14: qty 1

## 2014-12-14 NOTE — ED Notes (Signed)
The pt is c/o mid-chest pain since 1400 today no n v or sob.  The pain is worse with inspiration.  No cough or cold.  lmp 2 weeks ago

## 2014-12-14 NOTE — H&P (Signed)
[facility] History and Physical  Emil Klassen GNF:[id number] DOB: 06/10/1966 DOA: 12/14/2014  Referring physician: ED physician PCP: [facility]   Chief Complaint: chest pressure on treadmill  HPI:  Ms. [name] is a 49yo woman with PMH of FM, Dep/anxiety, IBS, bipolar disorder, chronic tachycardia, DM who presents for chest pressure.  Ms. [name] reports that while she was working out on treadmill today she developed substernal chest pressure that radiated into her arms and into her right neck.  The pain waxed and waned and felt like someone pressing on her chest.  It also moved into her left chest.  She has a history of chronic tachycardia and has followed with Cardiology in the past.  She reports being due to see them next week.  She reports chronic congestion and occasional diarrhea from IBS.  She attempted to take a flexeril and klonipin, thinking the pain may be from her fibromyalgia, but this did not help.  Her pain began to improve after getting aspirin and nitroglycerin in the ED.  EKG showed tachycardia and qwaves, but appeared unchanged.  CXR showed minimal atelectasis.    Assessment and Plan: Chest pain with activity - Concerning for ACS given nature of symptoms, however, she has no personal history of CAD or FH of CAD.  Heart score 3 - Admit to telemetry - CE X 2 - AM EKG - Consider stress test of some sort in the AM - NPO p MN for possible testing - Lipid panel with AML  Chronic Sinus Tachycardia - She reports chronic sinus tachycardia - She notes that she is due for a holter monitor and TTE as an outpatient this coming week  Depression/Anxiety/Bipolar - Continue home meds including klonipin, duloxetine, lurasidone    Fibromyalgia - Continue flexeril and other medications noted above    DM (diabetes mellitus) - She is taking metformin and cinnamon - Hold home meds, SSI in anticipation of cardiac study in the AM - A1C  Leukocytosis (10.9) - Unclear cause, stress  reaction?  - She has no urinary symptoms, CXR is below - Repeat with diff in the AM   Radiological Exams on Admission: Dg Chest 2 View  12/14/2014   CLINICAL DATA:  Chest pain for 1 day, primarily midsternal  EXAM: CHEST  2 VIEW  COMPARISON:  October 29, 2014  FINDINGS: There is minimal atelectasis in the left base. Elsewhere, the lungs are clear. The heart size and pulmonary vascularity are normal. No adenopathy. No pneumothorax. No bone lesions.  IMPRESSION: Minimal atelectasis left base.  No edema or consolidation.   Electronically Signed   By: Bretta Bang M.D.   On: 12/14/2014 17:36    Code Status: Full, no blood products Family Communication: Pt at bedside Disposition Plan: Admit for further evaluation    Review of Systems:  Constitutional: Negative for fever, chills and malaise/fatigue. Negative for diaphoresis.  HENT: Negative for hearing loss, ear pain, nosebleeds, congestion Eyes: Negative for blurred vision, double vision, photophobia Respiratory: Negative for cough, hemoptysis, sputum production, shortness of breath, wheezing  Cardiovascular: + for chest pressure which radiates to arms, neck. Negative for  palpitations, orthopnea, and leg swelling.  Gastrointestinal: Negative for nausea, vomiting and abdominal pain. Negative for heartburn, constipation, blood in stool and melena.  Genitourinary: Negative for dysuria, urgency, frequency Musculoskeletal: Negative for myalgias, back pain, joint pain and falls.  Skin: Negative for itching and rash.  Neurological: Negative for dizziness and weakness. Negative for sensory change, speech change, focal weakness, loss of consciousness  and headaches.   Psychiatric/Behavioral: . The patient is not nervous/anxious currently.      Past Medical History  Diagnosis Date  . Depressed   . Fibromyalgia   . Anxiety   . Bipolar disorder     Past Surgical History  Procedure Laterality Date  . Cholecystectomy      Social History:   reports that she has quit smoking. She has quit using smokeless tobacco. She reports that she does not drink alcohol or use illicit drugs.  Allergies  Allergen Reactions  . Lamictal [Lamotrigine] Rash    Family History  Problem Relation Age of Onset  . Bipolar disorder Mother   . Alcohol abuse Father     She brought her pills in and I reviewed them.  I have removed the ones she is not taking.  She is also on duloxetine.  Prior to Admission medications   Medication Sig Start Date End Date Taking? Authorizing Provider         clonazePAM (KLONOPIN) 1 MG tablet Take 1 mg by mouth 4 (four) times daily as needed for anxiety.    Historical Provider, MD  cyclobenzaprine (FLEXERIL) 10 MG tablet Take 10 mg by mouth 3 (three) times daily as needed for muscle spasms.    Historical Provider, MD  diphenhydrAMINE (BENADRYL) 25 MG tablet Take 50 mg by mouth at bedtime. For allergies, sleep    Historical Provider, MD                                     lurasidone (LATUDA) 40 MG TABS Take 80 mg by mouth daily with breakfast.     Historical Provider, MD  metFORMIN (GLUCOPHAGE) 1000 MG tablet Take 1,000 mg by mouth 2 (two) times daily with a meal.    Historical Provider, MD                                Physical Exam: Filed Vitals:   12/14/14 1900 12/14/14 1930 12/14/14 2000 12/14/14 2030  BP: 109/78 117/92 118/80 121/80  Pulse: 94 93 90 90  Temp:      TempSrc:      Resp: 18 16 11 16   Height:      Weight:      SpO2: 94% 95% 99% 93%    Physical Exam  Constitutional: Appears well-developed and well-nourished. No distress.   HENT: Normocephalic. Oropharynx is clear and moist.  Eyes: Conjunctivae normal.  no scleral icterus.  Neck: Normal ROM. Neck supple. No JVD CVS: RR, NR, S1/S2 +, no murmurs, chest pain is not reproducible Pulmonary: Effort and breath sounds normal, rhonchi, wheezes, rales.  Abdominal: Soft. BS +,  no distension, tenderness Musculoskeletal: trace edema to bilateral  LE, no tenderness.  Neuro: Alert. No cranial nerve deficit.  Motor exam grossly normal.  Skin: Skin is warm and dry. No rash noted Psychiatric: Normal mood and affect. Behavior, judgment, thought content normal.   Labs on Admission:  Basic Metabolic Panel:  Recent Labs Lab 12/14/14 1632  NA 137  K 4.2  CL 104  CO2 27  GLUCOSE 90  BUN 9  CREATININE 0.83  CALCIUM 9.6   CBC:  Recent Labs Lab 12/14/14 1632  WBC 10.9*  HGB 13.3  HCT 38.6  MCV 87.5  PLT 269   Cardiac Enzymes:  Recent Labs Lab 12/14/14 1632  TROPONINI <  0.03   EKG: sinus tachycardia, q waves in II, III, aVF.  Appears relatively unchanged from previous.  Repeat in the AM.   Debe Coder, MD  [phone]  If 7PM-7AM, please contact night-coverage [email or web address] Password [facility] 12/14/2014, 8:58 PM

## 2014-12-14 NOTE — ED Provider Notes (Signed)
CSN: [id number]     Arrival date & time 12/14/14  1623 History   First MD Initiated Contact with Patient 12/14/14 1754     Chief Complaint  Patient presents with  . Chest Pain     (Consider location/radiation/quality/duration/timing/severity/associated sxs/prior Treatment) HPI Comments: Patient is a 49 yo F PMHx significant for depression, fibromyalgia, anxiety, bipolar disorder, DM presenting to the emergency department for mid chest pressure with radiation to bilateral shoulders and neck without associated diaphoresis, nausea, vomiting, shortness of breath, lower extremity edema that has been intermittent since 2 PM this afternoon. She states her last episode of pain began 20 minutes ago, currently rates it a 5 out of 10. She states she has had 2 similar episodes of chest pain, is due to follow-up with cardiology for Holter monitoring and echocardiogram for chronic tachycardia. No early familial cardiac history. No history of stress test or cardiac catheterizations.  Patient is a 49 y.o. female presenting with chest pain. The history is provided by the patient.  Chest Pain Pain location:  Substernal area Pain quality: pressure   Pain radiates to:  L shoulder, R shoulder and neck Pain radiates to the back: no   Pain severity:  Moderate Onset quality:  Sudden Duration:  5 hours Timing:  Intermittent Progression:  Unchanged Chronicity:  Recurrent Context: at rest   Context: not breathing, no drug use, not eating, no intercourse, not lifting, no movement, not raising an arm, no stress and no trauma   Relieved by:  Nothing Worsened by:  Deep breathing Ineffective treatments: Flexeril, Klonopin. Associated symptoms: no abdominal pain, no AICD problem, no altered mental status, no anorexia, no anxiety, no back pain, no cough, no diaphoresis, no dizziness, no fever, no headache, no lower extremity edema, no nausea, no near-syncope, no numbness, no orthopnea, no palpitations, no shortness of  breath, not vomiting and no weakness   Risk factors: diabetes mellitus and smoking (former)   Risk factors: no aortic disease, no birth control, no Ehlers-Danlos syndrome, no high cholesterol, no hypertension, no immobilization, not female, no Marfan's syndrome, not obese, not pregnant, no prior DVT/PE and no surgery     Past Medical History  Diagnosis Date  . Depressed   . Fibromyalgia   . Anxiety   . Bipolar disorder    Past Surgical History  Procedure Laterality Date  . Cholecystectomy     Family History  Problem Relation Age of Onset  . Bipolar disorder Mother   . Alcohol abuse Father    History  Substance Use Topics  . Smoking status: Former Games developer  . Smokeless tobacco: Former Neurosurgeon  . Alcohol Use: No   OB History    No data available     Review of Systems  Constitutional: Negative for fever and diaphoresis.  Respiratory: Negative for cough and shortness of breath.   Cardiovascular: Positive for chest pain. Negative for palpitations, orthopnea and near-syncope.  Gastrointestinal: Negative for nausea, vomiting, abdominal pain and anorexia.  Musculoskeletal: Negative for back pain.  Neurological: Negative for dizziness, weakness, numbness and headaches.  All other systems reviewed and are negative.     Allergies  Lamictal  Home Medications   Prior to Admission medications   Medication Sig Start Date End Date Taking? Authorizing Provider  busPIRone (BUSPAR) 15 MG tablet Take 15 mg by mouth 3 (three) times daily.    Historical Provider, MD  clonazePAM (KLONOPIN) 1 MG tablet Take 1 mg by mouth 4 (four) times daily as needed for anxiety.  Historical Provider, MD  cyclobenzaprine (FLEXERIL) 10 MG tablet Take 10 mg by mouth 3 (three) times daily as needed for muscle spasms.    Historical Provider, MD  diphenhydrAMINE (BENADRYL) 25 MG tablet Take 50 mg by mouth at bedtime. For allergies, sleep    Historical Provider, MD  gabapentin (NEURONTIN) 400 MG capsule Take 400  mg by mouth 3 (three) times daily.    Historical Provider, MD  Chilton SiGreen Coffee Bean 400 MG CAPS Take 800 mg by mouth 2 (two) times daily.    Historical Provider, MD  HYDROXYZINE PAMOATE PO Take 25 mg by mouth daily.    Historical Provider, MD  Lactobacillus (ACIDOPHILUS PO) Take 1 capsule by mouth at bedtime.    Historical Provider, MD  LORazepam (ATIVAN) 2 MG tablet Take 2 mg by mouth every 8 (eight) hours as needed for anxiety.    Historical Provider, MD  lurasidone (LATUDA) 40 MG TABS Take 80 mg by mouth daily with breakfast.     Historical Provider, MD  metFORMIN (GLUCOPHAGE) 1000 MG tablet Take 1,000 mg by mouth 2 (two) times daily with a meal.    Historical Provider, MD  metFORMIN (GLUCOPHAGE) 850 MG tablet Take 1 tablet (850 mg total) by mouth daily. Patient not taking: Reported on 10/29/2014 03/12/13   Gavin PoundMichael Y. Ghim, MD  OVER THE COUNTER MEDICATION Take 1 tablet by mouth 2 (two) times daily. Triple-tea fat burner    Historical Provider, MD  PARoxetine (PAXIL) 40 MG tablet Take 20 mg by mouth every morning.    Historical Provider, MD  ranitidine (ZANTAC) 150 MG tablet Take 150 mg by mouth 2 (two) times daily as needed for heartburn.    Historical Provider, MD   BP 121/80 mmHg  Pulse 90  Temp(Src) 98.3 F (36.8 C) (Oral)  Resp 16  Ht 5' (1.524 m)  Wt 197 lb (89.359 kg)  BMI 38.47 kg/m2  SpO2 93%  LMP 11/30/2014 (Approximate) Physical Exam  Constitutional: She is oriented to person, place, and time. She appears well-developed and well-nourished. No distress.  HENT:  Head: Normocephalic and atraumatic.  Right Ear: External ear normal.  Left Ear: External ear normal.  Nose: Nose normal.  Mouth/Throat: Oropharynx is clear and moist. No oropharyngeal exudate.  Eyes: Conjunctivae are normal.  Neck: Neck supple.  Cardiovascular: Normal rate, regular rhythm and normal heart sounds.   Pulmonary/Chest: Effort normal and breath sounds normal. No respiratory distress.  Abdominal: Soft.  There is no tenderness.  Musculoskeletal: Normal range of motion. She exhibits no edema.  Neurological: She is alert and oriented to person, place, and time.  Skin: Skin is warm and dry. She is not diaphoretic.  Psychiatric:  Flat affect  Nursing note and vitals reviewed.   ED Course  Procedures (including critical care time) Medications  nitroGLYCERIN (NITROSTAT) SL tablet 0.4 mg (0.4 mg Sublingual Given 12/14/14 1850)  aspirin chewable tablet 324 mg (324 mg Oral Given 12/14/14 1850)    Labs Review Labs Reviewed  CBC - Abnormal; Notable for the following:    WBC 10.9 (*)    All other components within normal limits  BASIC METABOLIC PANEL - Abnormal; Notable for the following:    GFR calc non Af Amer 83 (*)    All other components within normal limits  TROPONIN I    Imaging Review Dg Chest 2 View  12/14/2014   CLINICAL DATA:  Chest pain for 1 day, primarily midsternal  EXAM: CHEST  2 VIEW  COMPARISON:  October 29, 2014  FINDINGS: There is minimal atelectasis in the left base. Elsewhere, the lungs are clear. The heart size and pulmonary vascularity are normal. No adenopathy. No pneumothorax. No bone lesions.  IMPRESSION: Minimal atelectasis left base.  No edema or consolidation.   Electronically Signed   By: Bretta Bang M.D.   On: 12/14/2014 17:36     EKG Interpretation   Date/Time:  Saturday December 14 2014 16:25:53 EST Ventricular Rate:  104 PR Interval:  144 QRS Duration: 78 QT Interval:  332 QTC Calculation: 436 R Axis:   111 Text Interpretation:  Sinus tachycardia Low voltage QRS Left posterior  fascicular block Abnormal ECG No significant change since last tracing  Confirmed by KNAPP  MD-J, JON ([id number]) on 12/14/2014 9:00:40 PM      MDM   Final diagnoses:  Chest pain, central    Filed Vitals:   12/14/14 2030  BP: 121/80  Pulse: 90  Temp:   Resp: 16   Afebrile, NAD, non-toxic appearing, AAOx4.  I have reviewed nursing notes, vital signs, and all  appropriate lab and imaging results for this patient.  Concern for cardiac etiology of Chest Pain. Hospitalist has been consulted and will see patient in the ED for likely admit. Pt does not meet criteria for CP protocol and a further evaluation is recommended. Pt has been re-evaluated prior to consult and VSS, NAD, heart RRR, pain 0/10, lungs CTAB. No acute abnormalities found on EKG and first round of cardiac enzymes negative. This case was discussed with Dr. Lynelle Doctor who agrees with plan to admit.      Jeannetta Ellis, PA-C 12/14/14 2101  Linwood Dibbles, MD 12/14/14 2155

## 2014-12-15 ENCOUNTER — Observation Stay (HOSPITAL_COMMUNITY): Payer: Medicare Other

## 2014-12-15 ENCOUNTER — Observation Stay (HOSPITAL_COMMUNITY): Payer: No Typology Code available for payment source

## 2014-12-15 DIAGNOSIS — R079 Chest pain, unspecified: Secondary | ICD-10-CM | POA: Diagnosis not present

## 2014-12-15 DIAGNOSIS — F319 Bipolar disorder, unspecified: Secondary | ICD-10-CM | POA: Diagnosis not present

## 2014-12-15 DIAGNOSIS — F419 Anxiety disorder, unspecified: Secondary | ICD-10-CM | POA: Diagnosis not present

## 2014-12-15 DIAGNOSIS — M797 Fibromyalgia: Secondary | ICD-10-CM | POA: Diagnosis not present

## 2014-12-15 LAB — CBC WITH DIFFERENTIAL/PLATELET
Basophils Absolute: 0 10*3/uL (ref 0.0–0.1)
Basophils Relative: 0 % (ref 0–1)
EOS ABS: 0.2 10*3/uL (ref 0.0–0.7)
Eosinophils Relative: 2 % (ref 0–5)
HCT: 37.2 % (ref 36.0–46.0)
Hemoglobin: 12.5 g/dL (ref 12.0–15.0)
LYMPHS ABS: 4.4 10*3/uL — AB (ref 0.7–4.0)
LYMPHS PCT: 46 % (ref 12–46)
MCH: 30.2 pg (ref 26.0–34.0)
MCHC: 33.6 g/dL (ref 30.0–36.0)
MCV: 89.9 fL (ref 78.0–100.0)
Monocytes Absolute: 0.7 10*3/uL (ref 0.1–1.0)
Monocytes Relative: 7 % (ref 3–12)
NEUTROS ABS: 4.4 10*3/uL (ref 1.7–7.7)
Neutrophils Relative %: 45 % (ref 43–77)
Platelets: 257 10*3/uL (ref 150–400)
RBC: 4.14 MIL/uL (ref 3.87–5.11)
RDW: 13.4 % (ref 11.5–15.5)
WBC: 9.7 10*3/uL (ref 4.0–10.5)

## 2014-12-15 LAB — LIPID PANEL
CHOLESTEROL: 144 mg/dL (ref 0–200)
HDL: 27 mg/dL — ABNORMAL LOW (ref >39)
LDL CALC: 62 mg/dL (ref 0–99)
TRIGLYCERIDES: 276 mg/dL — AB (ref <150)
Total CHOL/HDL Ratio: 5.3 RATIO
VLDL: 55 mg/dL — AB (ref 0–40)

## 2014-12-15 LAB — BASIC METABOLIC PANEL
ANION GAP: 7 (ref 5–15)
BUN: 10 mg/dL (ref 6–23)
CHLORIDE: 103 mmol/L (ref 96–112)
CO2: 27 mmol/L (ref 19–32)
Calcium: 9 mg/dL (ref 8.4–10.5)
Creatinine, Ser: 0.78 mg/dL (ref 0.50–1.10)
GFR calc Af Amer: >90 mL/min (ref >90)
GFR calc non Af Amer: >90 mL/min (ref >90)
Glucose, Bld: 116 mg/dL — ABNORMAL HIGH (ref 70–99)
Potassium: 3.3 mmol/L — ABNORMAL LOW (ref 3.5–5.1)
Sodium: 137 mmol/L (ref 135–145)

## 2014-12-15 LAB — GLUCOSE, CAPILLARY
Glucose-Capillary: 113 mg/dL — ABNORMAL HIGH (ref 70–99)
Glucose-Capillary: 148 mg/dL — ABNORMAL HIGH (ref 70–99)

## 2014-12-15 LAB — TROPONIN I
Troponin I: <0.03 ng/mL (ref <0.031)
Troponin I: <0.03 ng/mL (ref <0.031)

## 2014-12-15 LAB — MAGNESIUM: MAGNESIUM: 1.8 mg/dL (ref 1.5–2.5)

## 2014-12-15 MED ORDER — FENOFIBRATE 160 MG PO TABS: 160.0000 mg | ORAL_TABLET | Freq: Every day | ORAL | Status: DC

## 2014-12-15 MED ORDER — POTASSIUM CHLORIDE 10 MEQ/100ML IV SOLN
10.0000 meq | INTRAVENOUS | Status: AC
  Administered 2014-12-15 (×2): 10 meq via INTRAVENOUS
  Filled 2014-12-15: qty 100

## 2014-12-15 MED ORDER — TECHNETIUM TC 99M SESTAMIBI GENERIC - CARDIOLITE
30.0000 | Freq: Once | INTRAVENOUS | Status: AC | PRN
  Administered 2014-12-15: 30 via INTRAVENOUS

## 2014-12-15 NOTE — Discharge Summary (Signed)
Physician Discharge Summary  Gwendolyn Green WJX:[id number] DOB: 1966-04-08 DOA: 12/14/2014  PCP: [facility]  Admit date: 12/14/2014 Discharge date: 12/15/2014  Time spent: 35 minutes  Recommendations for Outpatient Follow-up:  Patient will be discharged to home.  She is to follow up with her primary care physician within one week and cardiologist at the [facility] hospital as needed.  Patient should continue trying medication as prescribed. Patient saw a heart healthy/carb modified diet. Patient may resume activity as tolerated.  Discharge Diagnoses:  Principal Problem:   Chest pain Active Problems:   Tachycardia   Depression   Anxiety   Fibromyalgia   DM (diabetes mellitus)   Discharge Condition: Stable  Diet recommendation: heart healthy/carb modified  Filed Weights   12/14/14 1630 12/14/14 2211  Weight: 89.359 kg (197 lb) 91.218 kg (201 lb 1.6 oz)    History of present illness:  On 12/14/2014 by Dr. Debe Coder Gwendolyn Green is a 49yo woman with PMH of FM, Dep/anxiety, IBS, bipolar disorder, chronic tachycardia, DM who presents for chest pressure. Gwendolyn Green reports that while she was working out on treadmill today she developed substernal chest pressure that radiated into her arms and into her right neck. The pain waxed and waned and felt like someone pressing on her chest. It also moved into her left chest. She has a history of chronic tachycardia and has followed with Cardiology in the past. She reports being due to see them next week. She reports chronic congestion and occasional diarrhea from IBS. She attempted to take a flexeril and klonipin, thinking the pain may be from her fibromyalgia, but this did not help. Her pain began to improve after getting aspirin and nitroglycerin in the ED. EKG showed tachycardia and qwaves, but appeared unchanged. CXR showed minimal atelectasis.   Hospital Course:  Chest pain -Troponins negative 3 -Cardiology consulted and appreciated,  recommended Myoview -Several risk factors for coronary artery disease including diabetes, obesity -Patient currently chest pain-free however chest pain was brought on by activity -Lipid panel: Total cholesterol 144, triglycerides 276, HDL 27, LDL 62 -Stress myoview: no perfusion defect to suggest ischemia or infarction, EF 70%, low risk stress finding  Hypertriglyceridemia -Will start patient on fenofibrate at discharge  Chronic sinus tachycardia -TSH pending -Patient states she is supposed to echocardiogram as well as monitor this coming week  Depression/anxiety/bipolar disorder -Continue home medications  Fibromyalgia -Continue home medications and Flexeril  Diabetes mellitus, type II -Continue home regimen -Hemoglobin A1c pending  Leukocytosis -Was 10.9 upon admission, currently resolved -Likely stress reaction -Patient denies any urinary symptoms, chest x-ray shows no infection   Procedures: Stress Myoveiw  Consultations: Cardiology  Discharge Exam: Filed Vitals:   12/15/14 1438  BP: 124/83  Pulse: 108  Temp:   Resp:      General: Well developed, well nourished, NAD, appears stated age  HEENT: NCAT,mucous membranes moist.  Cardiovascular: S1 S2 auscultated, no rubs, murmurs or gallops. Regular rate and rhythm.  Respiratory: Cl ear to auscultation bilaterally with equal chest rise  Abdomen: Soft, obese, nontender, nondistended, + bowel sounds  Extremities: warm dry without cyanosis clubbing or edema  Neuro: AAOx3, nonfocal  Psych: Normal affect and demeanor with intact judgement and insight  Discharge Instructions      Discharge Instructions    Discharge instructions    Complete by:  As directed   Patient will be discharged to home.  She is to follow up with her primary care physician within one week and cardiologist at the [facility]  hospital as needed.  Patient should continue trying medication as prescribed. Patient saw a heart healthy/carb modified  diet. Patient may resume activity as tolerated.            Medication List    TAKE these medications        Cinnamon 500 MG capsule  Take 500 mg by mouth 2 (two) times daily.     clonazePAM 1 MG tablet  Commonly known as:  KLONOPIN  Take 1 mg by mouth 4 (four) times daily as needed for anxiety.     cyclobenzaprine 10 MG tablet  Commonly known as:  FLEXERIL  Take 10 mg by mouth 3 (three) times daily as needed for muscle spasms.     DULoxetine 30 MG capsule  Commonly known as:  CYMBALTA  Take 30 mg by mouth every evening.     fenofibrate 160 MG tablet  Take 1 tablet (160 mg total) by mouth daily.     lurasidone 40 MG Tabs tablet  Commonly known as:  LATUDA  Take 80 mg by mouth daily with breakfast.     metFORMIN 1000 MG tablet  Commonly known as:  GLUCOPHAGE  Take 1,000 mg by mouth 2 (two) times daily with a meal.     OVER THE COUNTER MEDICATION  Take 1 tablet by mouth 3 (three) times daily. Coconut oil       Allergies  Allergen Reactions  . Lamictal [Lamotrigine] Rash   Follow-up Information    Follow up with [facility]. Schedule an appointment as soon as possible for a visit in 1 week.   Why:  Hospital followup       The results of significant diagnostics from this hospitalization (including imaging, microbiology, ancillary and laboratory) are listed below for reference.    Significant Diagnostic Studies: Dg Chest 2 View  12/14/2014   CLINICAL DATA:  Chest pain for 1 day, primarily midsternal  EXAM: CHEST  2 VIEW  COMPARISON:  October 29, 2014  FINDINGS: There is minimal atelectasis in the left base. Elsewhere, the lungs are clear. The heart size and pulmonary vascularity are normal. No adenopathy. No pneumothorax. No bone lesions.  IMPRESSION: Minimal atelectasis left base.  No edema or consolidation.   Electronically Signed   By: Bretta Bang M.D.   On: 12/14/2014 17:36   Nm Myocar Single W/spect W/wall Motion And Ef  12/15/2014   CLINICAL DATA:   Chest pain  EXAM: MYOCARDIAL IMAGING WITH SPECT ( PHARMACOLOGIC-STRESS ONLY PROTOCOL)  GATED LEFT VENTRICULAR WALL MOTION STUDY  LEFT VENTRICULAR EJECTION FRACTION  TECHNIQUE: On the initial day, intravenous infusion of Lexiscan was performed under the supervision of the Cardiology staff. At peak effect of the drug, 30 mCi Tc-49m sestamibi was injected intravenously and standard myocardial SPECT imaging was performed. Quantitative gated imaging was also performed to evaluate left ventricular wall motion, and estimate left ventricular ejection fraction.  COMPARISON:  None.  FINDINGS:  BOLD: FINDINGS:  BOLD Perfusion: No decreased activity in the left ventricle on stress imaging to suggest reversible ischemia or infarction.  Wall Motion: Normal left ventricular wall motion. No left ventricular dilation.  Left Ventricular Ejection Fraction: 70 %  End diastolic volume 10 ml  End systolic volume 34 ml  IMPRESSION: 1. No perfusion defect to suggest ischemia or infarction.  2. Normal left ventricular wall motion.  3. Left ventricular ejection fraction 70%  4. Low  -risk stress test findings*.  *2012 Appropriate Use Criteria for Coronary Revascularization Focused Update: J Am  Coll Cardiol. 2012;59(9):857-881. http://content.dementiazones.comonlinejacc.org/article.aspx?articleid=1201161   Electronically Signed   By: Oley Balmaniel  Hassell M.D.   On: 12/15/2014 13:46    Microbiology: No results found for this or any previous visit (from the past 240 hour(s)).   Labs: Basic Metabolic Panel:  Recent Labs Lab 12/14/14 1632 12/15/14 0354 12/15/14 0700  NA 137 137  --   K 4.2 3.3*  --   CL 104 103  --   CO2 27 27  --   GLUCOSE 90 116*  --   BUN 9 10  --   CREATININE 0.83 0.78  --   CALCIUM 9.6 9.0  --   MG  --   --  1.8   Liver Function Tests: No results for input(s): AST, ALT, ALKPHOS, BILITOT, PROT, ALBUMIN in the last 168 hours. No results for input(s): LIPASE, AMYLASE in the last 168 hours. No results for input(s): AMMONIA  in the last 168 hours. CBC:  Recent Labs Lab 12/14/14 1632 12/15/14 0354  WBC 10.9* 9.7  NEUTROABS  --  4.4  HGB 13.3 12.5  HCT 38.6 37.2  MCV 87.5 89.9  PLT 269 257   Cardiac Enzymes:  Recent Labs Lab 12/14/14 1632 12/14/14 2322 12/15/14 0040 12/15/14 0354  TROPONINI <0.03 <0.03 <0.03 <0.03   BNP: BNP (last 3 results) No results for input(s): PROBNP in the last 8760 hours. CBG:  Recent Labs Lab 12/14/14 2229 12/15/14 0746 12/15/14 1301  GLUCAP 102* 113* 148*       Signed:  Decarlos Empey  [facility] 12/15/2014, 2:39 PM

## 2014-12-15 NOTE — Progress Notes (Signed)
Utilization Review Completed.   Libbey Duce, RN, BSN Nurse Case Manager  

## 2014-12-15 NOTE — Discharge Instructions (Signed)

## 2014-12-15 NOTE — Consult Note (Signed)
CARDIOLOGY CONSULT NOTE  Patient ID: Gwendolyn Green MRN: [id number] DOB/AGE: [birth year] 47 y.o.  Admit date: 12/14/2014 Referring Physician:  Internal Medicine Primary Physician:  [facility] Reason for Consultation:  Chest pain  HPI: Gwendolyn Green is a 49 year old caucasian female with a history of DM, fibromyalgia, anxiety and depression, and chronic tachycardia who presented to the ED with complaints of chest pain beginning around 2pm while walking on the treadmill yesterday.  She states she had only been walking a few minutes when she developed mid to left sided pain like someone was poking her chest.  She also reports pain in bilateral arms and right side of her neck. She denies any associated symptoms including nausea, SOB, or diaphoresis.  She took a Flexeril and then a Klonopin which did not relieve her pain so she went to the ED.  She was given ASA and SL ntg, after which her pain resolved.   She denies a history of HTN or hyperlipidemia.  She is a former smoker, quit ~10 years ago.  She denies any SOB, PND, orthopnea, edema, dizziness, syncope, or symptoms suggestive of claudication or TIA.   Past Medical History  Diagnosis Date  . Depressed   . Fibromyalgia   . Anxiety   . Bipolar disorder      Past Surgical History  Procedure Laterality Date  . Cholecystectomy       Family History  Problem Relation Age of Onset  . Bipolar disorder Mother   . Alcohol abuse Father      Social History: History   Social History  . Marital Status: Divorced    Spouse Name: N/A    Number of Children: N/A  . Years of Education: N/A   Occupational History  . Not on file.   Social History Main Topics  . Smoking status: Former Games developer  . Smokeless tobacco: Former Neurosurgeon  . Alcohol Use: No  . Drug Use: No  . Sexual Activity: No   Other Topics Concern  . Not on file   Social History Narrative     Prescriptions prior to admission  Medication Sig Dispense Refill Last Dose  . Cinnamon  500 MG capsule Take 500 mg by mouth 2 (two) times daily.   12/13/2014 at Unknown time  . clonazePAM (KLONOPIN) 1 MG tablet Take 1 mg by mouth 4 (four) times daily as needed for anxiety.   12/14/2014 at Unknown time  . cyclobenzaprine (FLEXERIL) 10 MG tablet Take 10 mg by mouth 3 (three) times daily as needed for muscle spasms.   12/14/2014 at Unknown time  . DULoxetine (CYMBALTA) 30 MG capsule Take 30 mg by mouth every evening.   12/13/2014 at Unknown time  . lurasidone (LATUDA) 40 MG TABS Take 80 mg by mouth daily with breakfast.    12/13/2014 at Unknown time  . metFORMIN (GLUCOPHAGE) 1000 MG tablet Take 1,000 mg by mouth 2 (two) times daily with a meal.   12/14/2014 at Unknown time  . OVER THE COUNTER MEDICATION Take 1 tablet by mouth 3 (three) times daily. Coconut oil   12/14/2014 at Unknown time    Scheduled Meds: . enoxaparin (LOVENOX) injection  40 mg Subcutaneous Q24H  . insulin aspart  0-5 Units Subcutaneous QHS  . insulin aspart  0-9 Units Subcutaneous TID WC  . lurasidone  80 mg Oral Q breakfast   Continuous Infusions:  PRN Meds:.acetaminophen, clonazePAM, cyclobenzaprine, morphine injection, nitroGLYCERIN, ondansetron (ZOFRAN) IV  ROS: General: no fevers/chills/night sweats Eyes: no blurry vision,  diplopia, or amaurosis ENT: no sore throat or hearing loss Resp: no cough, wheezing, or hemoptysis CV: Reports chest pain, no edema or palpitations GI: no abdominal pain, nausea, vomiting, diarrhea, or constipation GU: no dysuria, frequency, or hematuria Skin: no rash Neuro: no headache, numbness, tingling, or weakness of extremities Musculoskeletal: no joint pain or swelling Heme: no bleeding, DVT, or easy bruising Endo: no polydipsia or polyuria    Physical Exam: Blood pressure 101/70, pulse 109, temperature 98.4 F (36.9 C), temperature source Oral, resp. rate 18, height  (1.549 m), weight 91.218 kg (201 lb 1.6 oz), last menstrual period 11/30/2014, SpO2 97 %.   General  appearance: alert, cooperative, appears older than stated age, no distress and moderately obese Neck: no adenopathy, no carotid bruit, no JVD, supple, symmetrical, trachea midline and thyroid not enlarged, symmetric, no tenderness/mass/nodules Lungs: clear to auscultation bilaterally Chest wall: left sided chest wall tenderness Heart: regular rate and rhythm, S1, S2 normal, no murmur, click, rub or gallop Abdomen: soft, non-tender; bowel sounds normal; no masses,  no organomegaly Extremities: extremities normal, atraumatic, no cyanosis or edema Pulses: 2+ and symmetric Skin: Skin color, texture, turgor normal. No rashes or lesions  Labs:   Lab Results  Component Value Date   WBC 9.7 12/15/2014   HGB 12.5 12/15/2014   HCT 37.2 12/15/2014   MCV 89.9 12/15/2014   PLT 257 12/15/2014    Recent Labs Lab 12/15/14 0354  NA 137  K 3.3*  CL 103  CO2 27  BUN 10  CREATININE 0.78  CALCIUM 9.0  GLUCOSE 116*   Lab Results  Component Value Date   CKTOTAL 106 11/25/2011   CKMB 2.4 11/25/2011   TROPONINI <0.03 12/15/2014    Lipid Panel     Component Value Date/Time   CHOL 144 12/15/2014 0354   TRIG 276* 12/15/2014 0354   HDL 27* 12/15/2014 0354   CHOLHDL 5.3 12/15/2014 0354   VLDL 55* 12/15/2014 0354   LDLCALC 62 12/15/2014 0354    EKG: normal EKG, normal sinus rhythm, unchanged from previous tracings.    Radiology: Dg Chest 2 View  12/14/2014   CLINICAL DATA:  Chest pain for 1 day, primarily midsternal  EXAM: CHEST  2 VIEW  COMPARISON:  October 29, 2014  FINDINGS: There is minimal atelectasis in the left base. Elsewhere, the lungs are clear. The heart size and pulmonary vascularity are normal. No adenopathy. No pneumothorax. No bone lesions.  IMPRESSION: Minimal atelectasis left base.  No edema or consolidation.   Electronically Signed   By: Bretta Bang M.D.   On: 12/14/2014 17:36   ASSESSMENT AND PLAN:  1. Chest pain 2. Hypertriglyceridemia 3. DM-2, A1c pending 4.  Fibromyalgia 5. Obesity moderate   Recommendation: Difficult to discern etiology of chest pain, due to chest wall tenderness and chronic pain, however, she is at significant risk for CAD due to obesity and diabetes. Schedule for exercise myoview today.  If this is normal, she can be discharged home with outpatient follow up. If abnormal, would likely warrant cardiac catheterization for further evaluation.  Hypertriglyceridemia likely due to uncontrolled diabetes, HbA1c pending.  Further recommendation to follow stress test. Add fenofibric acid at discharge and ASA 81 mg  Erling Conte, NP-C 12/15/2014, 9:58 AM  [facility]  Stress EKG: Exercise time 5 min on Bruce Protocol, 7.0 METs. No ischemia. Nuclear images pending. 2 day protocol due to obesity. I have personally reviewed the patient's record and performed physical exam and agree with  the assessment and plan of Ms. Marcy SalvoBridgette Allison, NP-C.  Pamella PertGANJI,JAGADEESH R, MD [facility] Pager: [phone].   Office: [phone] If no answer: Mobile: [phone]

## 2014-12-15 NOTE — Progress Notes (Signed)
[facility]                                                                              Patient Demographics  Gwendolyn Green, is a 49 y.o. female, DOB - 12-21-1965, ZOX:[id number]  Admit date - 12/14/2014   Admitting Physician Inez Catalina, MD  Outpatient Primary MD for the patient is [facility]  LOS - 1   Chief Complaint  Patient presents with  . Chest Pain        Assessment & Plan   Chest pain -Troponins negative 3 -Cardiology consulted and appreciated, recommended Myoview -Several risk factors for coronary artery disease including diabetes, obesity -Patient currently chest pain-free however chest pain was brought on by activity -Lipid panel: Total cholesterol 144, triglycerides 276, HDL 27, LDL 62   Hypertriglyceridemia -Will start patient on fenofibrate at discharge  Chronic sinus tachycardia -Will obtain TSH level -Patient states she is supposed to echocardiogram as well as monitor this coming week  Depression/anxiety/bipolar disorder -Continue home medications  Fibromyalgia -Continue home medications and Flexeril  Diabetes mellitus, type II -Continue home regimen -Hemoglobin A1c pending  Leukocytosis -Was 10.9 upon admission, currently resolved -Likely stress reaction -Patient denies any urinary symptoms, chest x-ray shows no infection  Code Status: Full  Family Communication: None at bedside  Disposition Plan: Admitted for observation  Time Spent in minutes   30 minutes  Procedures  Stress myoview  Consults   Cardiology  DVT Prophylaxis  Lovenox  Lab Results  Component Value Date   PLT 257 12/15/2014    Medications  Scheduled Meds: . enoxaparin (LOVENOX) injection  40 mg Subcutaneous Q24H  . insulin aspart  0-5 Units Subcutaneous QHS  . insulin aspart  0-9 Units Subcutaneous TID WC  . lurasidone  80 mg Oral Q breakfast   Continuous Infusions:  PRN Meds:.acetaminophen, clonazePAM, cyclobenzaprine, morphine injection,  nitroGLYCERIN, ondansetron (ZOFRAN) IV, technetium sestamibi generic  Antibiotics    Anti-infectives    None        Subjective:   Camey Linn seen and examined today.  Patient denies further chest pain.  Denies SOB, abdominal pain.  States she has been exercising for one month.  Objective:   Filed Vitals:   12/15/14 1145 12/15/14 1153 12/15/14 1154 12/15/14 1157  BP: 132/90 107/90 153/82 146/83  Pulse:      Temp:      TempSrc:      Resp: 16   25  Height:      Weight:      SpO2:        Wt Readings from Last 3 Encounters:  12/14/14 91.218 kg (201 lb 1.6 oz)  10/29/14 90.719 kg (200 lb)  11/25/11 95.21 kg (209 lb 14.4 oz)     Intake/Output Summary (Last 24 hours) at 12/15/14 1253 Last data filed at 12/15/14 0600  Gross per 24 hour  Intake    640 ml  Output      0 ml  Net    640 ml    Exam  General: Well developed, well nourished, NAD, appears stated age  HEENT: NCAT,mucous membranes moist.  Cardiovascular: S1 S2 auscultated, no rubs, murmurs or gallops. Regular rate and  rhythm.  Respiratory: Cl ear to auscultation bilaterally with equal chest rise  Abdomen: Soft, obese, nontender, nondistended, + bowel sounds  Extremities: warm dry without cyanosis clubbing or edema  Neuro: AAOx3, nonfocal  Psych: Normal affect and demeanor with intact judgement and insight  Data Review   Micro Results No results found for this or any previous visit (from the past 240 hour(s)).  Radiology Reports Dg Chest 2 View  12/14/2014   CLINICAL DATA:  Chest pain for 1 day, primarily midsternal  EXAM: CHEST  2 VIEW  COMPARISON:  October 29, 2014  FINDINGS: There is minimal atelectasis in the left base. Elsewhere, the lungs are clear. The heart size and pulmonary vascularity are normal. No adenopathy. No pneumothorax. No bone lesions.  IMPRESSION: Minimal atelectasis left base.  No edema or consolidation.   Electronically Signed   By: Bretta BangWilliam  Woodruff M.D.   On: 12/14/2014  17:36    CBC  Recent Labs Lab 12/14/14 1632 12/15/14 0354  WBC 10.9* 9.7  HGB 13.3 12.5  HCT 38.6 37.2  PLT 269 257  MCV 87.5 89.9  MCH 30.2 30.2  MCHC 34.5 33.6  RDW 13.2 13.4  LYMPHSABS  --  4.4*  MONOABS  --  0.7  EOSABS  --  0.2  BASOSABS  --  0.0    Chemistries   Recent Labs Lab 12/14/14 1632 12/15/14 0354 12/15/14 0700  NA 137 137  --   K 4.2 3.3*  --   CL 104 103  --   CO2 27 27  --   GLUCOSE 90 116*  --   BUN 9 10  --   CREATININE 0.83 0.78  --   CALCIUM 9.6 9.0  --   MG  --   --  1.8   ------------------------------------------------------------------------------------------------------------------ estimated creatinine clearance is 90.4 mL/min (by C-G formula based on Cr of 0.78). ------------------------------------------------------------------------------------------------------------------ No results for input(s): HGBA1C in the last 72 hours. ------------------------------------------------------------------------------------------------------------------  Recent Labs  12/15/14 0354  CHOL 144  HDL 27*  LDLCALC 62  TRIG 161276*  CHOLHDL 5.3   ------------------------------------------------------------------------------------------------------------------ No results for input(s): TSH, T4TOTAL, T3FREE, THYROIDAB in the last 72 hours.  Invalid input(s): FREET3 ------------------------------------------------------------------------------------------------------------------ No results for input(s): VITAMINB12, FOLATE, FERRITIN, TIBC, IRON, RETICCTPCT in the last 72 hours.  Coagulation profile No results for input(s): INR, PROTIME in the last 168 hours.  No results for input(s): DDIMER in the last 72 hours.  Cardiac Enzymes  Recent Labs Lab 12/14/14 2322 12/15/14 0040 12/15/14 0354  TROPONINI <0.03 <0.03 <0.03   ------------------------------------------------------------------------------------------------------------------ Invalid  input(s): POCBNP    Velma Hanna D.O. on 12/15/2014 at 12:53 PM  Between 7am to 7pm - Pager - [phone]  After 7pm go to [email or web address] - password [email or web address]  And look for the night coverage person covering for me after hours  [facility] Office  [phone]

## 2014-12-16 ENCOUNTER — Other Ambulatory Visit (HOSPITAL_COMMUNITY): Payer: No Typology Code available for payment source

## 2014-12-16 LAB — HEMOGLOBIN A1C
Hgb A1c MFr Bld: 6.4 % — ABNORMAL HIGH (ref 4.8–5.6)
Mean Plasma Glucose: 137 mg/dL

## 2015-07-28 ENCOUNTER — Emergency Department (HOSPITAL_COMMUNITY)
Admission: EM | Admit: 2015-07-28 | Discharge: 2015-07-28 | Disposition: A | Discharge status: Home / Self Care | Payer: Medicare Other | Attending: Emergency Medicine | Admitting: Emergency Medicine

## 2015-07-28 ENCOUNTER — Encounter (HOSPITAL_COMMUNITY): Payer: Self-pay | Admitting: *Deleted

## 2015-07-28 DIAGNOSIS — R52 Pain, unspecified: Secondary | ICD-10-CM | POA: Diagnosis present

## 2015-07-28 DIAGNOSIS — M25511 Pain in right shoulder: Secondary | ICD-10-CM | POA: Insufficient documentation

## 2015-07-28 DIAGNOSIS — M25562 Pain in left knee: Secondary | ICD-10-CM | POA: Diagnosis not present

## 2015-07-28 DIAGNOSIS — M546 Pain in thoracic spine: Secondary | ICD-10-CM | POA: Diagnosis not present

## 2015-07-28 DIAGNOSIS — M545 Low back pain: Secondary | ICD-10-CM | POA: Diagnosis not present

## 2015-07-28 DIAGNOSIS — F419 Anxiety disorder, unspecified: Secondary | ICD-10-CM | POA: Insufficient documentation

## 2015-07-28 DIAGNOSIS — M25572 Pain in left ankle and joints of left foot: Secondary | ICD-10-CM | POA: Diagnosis not present

## 2015-07-28 DIAGNOSIS — M25571 Pain in right ankle and joints of right foot: Secondary | ICD-10-CM | POA: Diagnosis not present

## 2015-07-28 DIAGNOSIS — Z79899 Other long term (current) drug therapy: Secondary | ICD-10-CM | POA: Diagnosis not present

## 2015-07-28 DIAGNOSIS — Z87891 Personal history of nicotine dependence: Secondary | ICD-10-CM | POA: Insufficient documentation

## 2015-07-28 DIAGNOSIS — F319 Bipolar disorder, unspecified: Secondary | ICD-10-CM | POA: Insufficient documentation

## 2015-07-28 DIAGNOSIS — M25561 Pain in right knee: Secondary | ICD-10-CM | POA: Diagnosis not present

## 2015-07-28 DIAGNOSIS — M542 Cervicalgia: Secondary | ICD-10-CM | POA: Diagnosis not present

## 2015-07-28 DIAGNOSIS — M25512 Pain in left shoulder: Secondary | ICD-10-CM | POA: Insufficient documentation

## 2015-07-28 DIAGNOSIS — M797 Fibromyalgia: Secondary | ICD-10-CM

## 2015-07-28 MED ORDER — KETOROLAC TROMETHAMINE 60 MG/2ML IM SOLN
60.0000 mg | Freq: Once | INTRAMUSCULAR | Status: AC
  Administered 2015-07-28: 60 mg via INTRAMUSCULAR
  Filled 2015-07-28: qty 2

## 2015-07-28 NOTE — ED Notes (Signed)
Pt has fibromyalgia and having pain all over.

## 2015-07-28 NOTE — ED Provider Notes (Signed)
CSN: [id number]     Arrival date & time 07/28/15  1814 History  This chart was scribed for Gwendolyn Fowler, PA-C, working with Vanetta Mulders, MD by Elon Spanner, ED Scribe. This patient was seen in room TR05C/TR05C and the patient's care was started at 7:22 PM.   Chief Complaint  Patient presents with  . Pain   The history is provided by the patient. No language interpreter was used.   HPI Comments: Raguel Kosloski is a 49 y.o. female with hx of depression (cymbalta, latuda), anxiety (klonopin), bipolar disorder, fibromyalgia (11 yrs, flexeril) who presents to the Emergency Department complaining of constant, moderate, unchanged generalized pain in the neck, entire back, shoulders, shins, and ankles onset yesterday without injury, described as a flare of her fibromyalgia.  Associated symptoms include mild nausea.  She reports she has been seen in the past for similar complaints and takes flexeril with improvement.  She has also received an unknown "shot" once at an urgent care which provided symptom relief.   She denies any decreased ROM or sensory/motor deficits.  She denies headache sore throat, cough, SOB, abdominal pain, vomiting, fever.    Past Medical History  Diagnosis Date  . Depressed   . Fibromyalgia   . Anxiety   . Bipolar disorder    Past Surgical History  Procedure Laterality Date  . Cholecystectomy     Family History  Problem Relation Age of Onset  . Bipolar disorder Mother   . Alcohol abuse Father    Social History  Substance Use Topics  . Smoking status: Former Games developer  . Smokeless tobacco: Former Neurosurgeon  . Alcohol Use: No   OB History    No data available     Review of Systems A complete 10 system review of systems was obtained and all systems are negative except as noted in the HPI and PMH.   Allergies  Lamictal  Home Medications   Prior to Admission medications   Medication Sig Start Date End Date Taking? Authorizing Provider  Cinnamon 500 MG capsule Take 500  mg by mouth 2 (two) times daily.    Historical Provider, MD  clonazePAM (KLONOPIN) 1 MG tablet Take 1 mg by mouth 4 (four) times daily as needed for anxiety.    Historical Provider, MD  cyclobenzaprine (FLEXERIL) 10 MG tablet Take 10 mg by mouth 3 (three) times daily as needed for muscle spasms.    Historical Provider, MD  DULoxetine (CYMBALTA) 30 MG capsule Take 30 mg by mouth every evening.    Historical Provider, MD  fenofibrate 160 MG tablet Take 1 tablet (160 mg total) by mouth daily. 12/15/14   Maryann Mikhail, DO  lurasidone (LATUDA) 40 MG TABS Take 80 mg by mouth daily with breakfast.     Historical Provider, MD  metFORMIN (GLUCOPHAGE) 1000 MG tablet Take 1,000 mg by mouth 2 (two) times daily with a meal.    Historical Provider, MD  OVER THE COUNTER MEDICATION Take 1 tablet by mouth 3 (three) times daily. Coconut oil    Historical Provider, MD   BP 137/96 mmHg  Pulse 109  Temp(Src) 98.2 F (36.8 C) (Oral)  Resp 18  SpO2 97% Physical Exam  Constitutional: She is oriented to person, place, and time. She appears well-developed and well-nourished. No distress.  HENT:  Head: Normocephalic and atraumatic.  Eyes: Conjunctivae are normal.  Neck: Neck supple. No tracheal deviation present.  Cardiovascular: Normal rate.   Pulmonary/Chest: Effort normal. No respiratory distress.  Musculoskeletal: Normal range  of motion.       Right shoulder: She exhibits tenderness.       Left shoulder: She exhibits tenderness.       Right knee: Tenderness found.       Left knee: Tenderness found.       Right ankle: Tenderness.       Left ankle: Tenderness.       Cervical back: She exhibits tenderness.       Thoracic back: She exhibits tenderness.       Lumbar back: She exhibits tenderness.  She displays TTP 12 of the 18 tender points of fibromyalgia including: suboccipital muscle, mid upper trapezius, supraspinatus, sternomastoid, upper outer quadrant of the buttock, and the anterior shin bilaterally.   Neurological: She is alert and oriented to person, place, and time. She has normal strength. No sensory deficit.  Skin: Skin is warm and dry.  Psychiatric: She has a normal mood and affect. Her behavior is normal.  Nursing note and vitals reviewed.   ED Course  Procedures (including critical care time) DIAGNOSTIC STUDIES: Oxygen Saturation is 97% on RA, normal by my interpretation.    COORDINATION OF CARE:  7:27 PM Discussed treatment plan with patient at bedside.  Patient acknowledges and agrees with plan.    Labs Review Labs Reviewed - No data to display  Imaging Review No results found. I have personally reviewed and evaluated these images and lab results as part of my medical decision-making.   EKG Interpretation None      MDM   Final diagnoses:  None    Patient presents with PMH significant for fibromyalgia presents with pain.  VSS, patient appears nontoxic, NAD.  On exam, she is tender in 12 of the 18 tender points of fibromyalgia.  No fevers, chills, CP, SOB, N/V/D.  Suspect fibromyalgia flare.  Low suspicion for traumatic injury. Imaging not performed.  Labs not performed.   Pt given toradol in ED. Suspect fibromyalgia flare. Pt stable for d/c.  Advised to follow up in 2 days with PCP.  Discussed return precautions and supportive care.  Patient acknowledges and agrees with the above plan.   I personally performed the services described in this documentation, which was scribed in my presence. The recorded information has been reviewed and is accurate.    Gwendolyn Fowler, PA-C 07/28/15 2104  Vanetta Mulders, MD 07/30/15 [phone]

## 2015-07-28 NOTE — Discharge Instructions (Signed)
Fibromyalgia Fibromyalgia is a disorder that is often misunderstood. It is associated with muscular pains and tenderness that comes and goes. It is often associated with fatigue and sleep disturbances. Though it tends to be long-lasting, fibromyalgia is not life-threatening. CAUSES  The exact cause of fibromyalgia is unknown. People with certain gene types are predisposed to developing fibromyalgia and other conditions. Certain factors can play a role as triggers, such as:  Spine disorders.  Arthritis.  Severe injury (trauma) and other physical stressors.  Emotional stressors. SYMPTOMS   The main symptom is pain and stiffness in the muscles and joints, which can vary over time.  Sleep and fatigue problems. Other related symptoms may include:  Bowel and bladder problems.  Headaches.  Visual problems.  Problems with odors and noises.  Depression or mood changes.  Painful periods (dysmenorrhea).  Dryness of the skin or eyes. DIAGNOSIS  There are no specific tests for diagnosing fibromyalgia. Patients can be diagnosed accurately from the specific symptoms they have. The diagnosis is made by determining that nothing else is causing the problems. TREATMENT  There is no cure. Management includes medicines and an active, healthy lifestyle. The goal is to enhance physical fitness, decrease pain, and improve sleep. HOME CARE INSTRUCTIONS   Only take over-the-counter or prescription medicines as directed by your caregiver. Sleeping pills, tranquilizers, and pain medicines may make your problems worse.  Low-impact aerobic exercise is very important and advised for treatment. At first, it may seem to make pain worse. Gradually increasing your tolerance will overcome this feeling.  Learning relaxation techniques and how to control stress will help you. Biofeedback, visual imagery, hypnosis, muscle relaxation, yoga, and meditation are all options.  Anti-inflammatory medicines and  physical therapy may provide short-term help.  Acupuncture or massage treatments may help.  Take muscle relaxant medicines as suggested by your caregiver.  Avoid stressful situations.  Plan a healthy lifestyle. This includes your diet, sleep, rest, exercise, and friends.  Find and practice a hobby you enjoy.  Join a fibromyalgia support group for interaction, ideas, and sharing advice. This may be helpful. SEEK MEDICAL CARE IF:  You are not having good results or improvement from your treatment. FOR MORE INFORMATION  National Fibromyalgia Association: www.fmaware.org Arthritis Foundation: www.arthritis.org Document Released: 11/01/2005 Document Revised: 01/24/2012 Document Reviewed: 02/11/2010 ExitCare Patient Information 2015 [employer]. This information is not intended to replace advice given to you by your health care provider. Make sure you discuss any questions you have with your health care provider.  

## 2018-02-15 ENCOUNTER — Other Ambulatory Visit: Payer: Self-pay

## 2018-02-15 ENCOUNTER — Encounter (HOSPITAL_COMMUNITY): Payer: Self-pay

## 2018-02-15 ENCOUNTER — Emergency Department (HOSPITAL_COMMUNITY): Payer: Medicare HMO

## 2018-02-15 ENCOUNTER — Observation Stay (HOSPITAL_COMMUNITY): Payer: Medicare HMO

## 2018-02-15 ENCOUNTER — Observation Stay (HOSPITAL_COMMUNITY)
Admission: EM | Admit: 2018-02-15 | Discharge: 2018-02-16 | Disposition: A | Discharge status: Home / Self Care | Payer: Medicare HMO | Attending: Internal Medicine | Admitting: Internal Medicine

## 2018-02-15 DIAGNOSIS — M797 Fibromyalgia: Secondary | ICD-10-CM | POA: Diagnosis not present

## 2018-02-15 DIAGNOSIS — F319 Bipolar disorder, unspecified: Secondary | ICD-10-CM | POA: Diagnosis not present

## 2018-02-15 DIAGNOSIS — E119 Type 2 diabetes mellitus without complications: Secondary | ICD-10-CM | POA: Diagnosis not present

## 2018-02-15 DIAGNOSIS — R6510 Systemic inflammatory response syndrome (SIRS) of non-infectious origin without acute organ dysfunction: Secondary | ICD-10-CM

## 2018-02-15 DIAGNOSIS — K219 Gastro-esophageal reflux disease without esophagitis: Secondary | ICD-10-CM | POA: Insufficient documentation

## 2018-02-15 DIAGNOSIS — F418 Other specified anxiety disorders: Secondary | ICD-10-CM | POA: Insufficient documentation

## 2018-02-15 DIAGNOSIS — E860 Dehydration: Secondary | ICD-10-CM | POA: Diagnosis not present

## 2018-02-15 DIAGNOSIS — E872 Acidosis, unspecified: Secondary | ICD-10-CM

## 2018-02-15 DIAGNOSIS — Z888 Allergy status to other drugs, medicaments and biological substances status: Secondary | ICD-10-CM | POA: Diagnosis not present

## 2018-02-15 DIAGNOSIS — E785 Hyperlipidemia, unspecified: Secondary | ICD-10-CM | POA: Diagnosis not present

## 2018-02-15 DIAGNOSIS — Z87891 Personal history of nicotine dependence: Secondary | ICD-10-CM | POA: Insufficient documentation

## 2018-02-15 DIAGNOSIS — K589 Irritable bowel syndrome without diarrhea: Secondary | ICD-10-CM | POA: Diagnosis not present

## 2018-02-15 DIAGNOSIS — M542 Cervicalgia: Secondary | ICD-10-CM | POA: Diagnosis present

## 2018-02-15 DIAGNOSIS — F4310 Post-traumatic stress disorder, unspecified: Secondary | ICD-10-CM | POA: Insufficient documentation

## 2018-02-15 DIAGNOSIS — E876 Hypokalemia: Secondary | ICD-10-CM | POA: Diagnosis present

## 2018-02-15 DIAGNOSIS — Z79899 Other long term (current) drug therapy: Secondary | ICD-10-CM | POA: Diagnosis not present

## 2018-02-15 DIAGNOSIS — E871 Hypo-osmolality and hyponatremia: Secondary | ICD-10-CM | POA: Insufficient documentation

## 2018-02-15 DIAGNOSIS — R55 Syncope and collapse: Secondary | ICD-10-CM | POA: Diagnosis not present

## 2018-02-15 DIAGNOSIS — Z7982 Long term (current) use of aspirin: Secondary | ICD-10-CM | POA: Insufficient documentation

## 2018-02-15 DIAGNOSIS — Z7984 Long term (current) use of oral hypoglycemic drugs: Secondary | ICD-10-CM | POA: Insufficient documentation

## 2018-02-15 DIAGNOSIS — I959 Hypotension, unspecified: Principal | ICD-10-CM | POA: Insufficient documentation

## 2018-02-15 HISTORY — DX: Irritable bowel syndrome, unspecified: K58.9

## 2018-02-15 HISTORY — DX: Post-traumatic stress disorder, unspecified: F43.10

## 2018-02-15 HISTORY — DX: Gastro-esophageal reflux disease without esophagitis: K21.9

## 2018-02-15 LAB — CBC WITH DIFFERENTIAL/PLATELET
Basophils Absolute: 0 10*3/uL (ref 0.0–0.1)
Basophils Relative: 0 %
EOS ABS: 0.2 10*3/uL (ref 0.0–0.7)
Eosinophils Relative: 1 %
HCT: 37.1 % (ref 36.0–46.0)
Hemoglobin: 12.8 g/dL (ref 12.0–15.0)
LYMPHS ABS: 3.7 10*3/uL (ref 0.7–4.0)
Lymphocytes Relative: 29 %
MCH: 30.5 pg (ref 26.0–34.0)
MCHC: 34.5 g/dL (ref 30.0–36.0)
MCV: 88.5 fL (ref 78.0–100.0)
MONO ABS: 0.8 10*3/uL (ref 0.1–1.0)
MONOS PCT: 6 %
Neutro Abs: 7.8 10*3/uL — ABNORMAL HIGH (ref 1.7–7.7)
Neutrophils Relative %: 64 %
PLATELETS: 250 10*3/uL (ref 150–400)
RBC: 4.19 MIL/uL (ref 3.87–5.11)
RDW: 12.2 % (ref 11.5–15.5)
WBC: 12.5 10*3/uL — ABNORMAL HIGH (ref 4.0–10.5)

## 2018-02-15 LAB — GLUCOSE, CAPILLARY
GLUCOSE-CAPILLARY: 152 mg/dL — AB (ref 65–99)
Glucose-Capillary: 154 mg/dL — ABNORMAL HIGH (ref 65–99)

## 2018-02-15 LAB — CBC
HCT: 36.9 % (ref 36.0–46.0)
Hemoglobin: 12.7 g/dL (ref 12.0–15.0)
MCH: 30.5 pg (ref 26.0–34.0)
MCHC: 34.4 g/dL (ref 30.0–36.0)
MCV: 88.5 fL (ref 78.0–100.0)
Platelets: 287 10*3/uL (ref 150–400)
RBC: 4.17 MIL/uL (ref 3.87–5.11)
RDW: 12.3 % (ref 11.5–15.5)
WBC: 14 10*3/uL — ABNORMAL HIGH (ref 4.0–10.5)

## 2018-02-15 LAB — BASIC METABOLIC PANEL
ANION GAP: 16 — AB (ref 5–15)
Anion gap: 15 (ref 5–15)
BUN: 12 mg/dL (ref 6–20)
BUN: 10 mg/dL (ref 6–20)
CALCIUM: 8.7 mg/dL — AB (ref 8.9–10.3)
CO2: 18 mmol/L — ABNORMAL LOW (ref 22–32)
CO2: 19 mmol/L — AB (ref 22–32)
CREATININE: 0.84 mg/dL (ref 0.44–1.00)
Calcium: 8.7 mg/dL — ABNORMAL LOW (ref 8.9–10.3)
Chloride: 94 mmol/L — ABNORMAL LOW (ref 101–111)
Chloride: 91 mmol/L — ABNORMAL LOW (ref 101–111)
Creatinine, Ser: 0.84 mg/dL (ref 0.44–1.00)
GFR calc Af Amer: >60 mL/min (ref >60)
Glucose, Bld: 198 mg/dL — ABNORMAL HIGH (ref 65–99)
Glucose, Bld: 204 mg/dL — ABNORMAL HIGH (ref 65–99)
POTASSIUM: 3.1 mmol/L — AB (ref 3.5–5.1)
Potassium: 3.8 mmol/L (ref 3.5–5.1)
SODIUM: 127 mmol/L — AB (ref 135–145)
SODIUM: 126 mmol/L — AB (ref 135–145)

## 2018-02-15 LAB — URINALYSIS, ROUTINE W REFLEX MICROSCOPIC
Bilirubin Urine: NEGATIVE
Glucose, UA: NEGATIVE mg/dL
HGB URINE DIPSTICK: NEGATIVE
Ketones, ur: 5 mg/dL — AB
Leukocytes, UA: NEGATIVE
Nitrite: NEGATIVE
PROTEIN: NEGATIVE mg/dL
Specific Gravity, Urine: 1.018 (ref 1.005–1.030)
pH: 5 (ref 5.0–8.0)

## 2018-02-15 LAB — RAPID URINE DRUG SCREEN, HOSP PERFORMED
Amphetamines: NOT DETECTED
BARBITURATES: NOT DETECTED
Benzodiazepines: NOT DETECTED
COCAINE: NOT DETECTED
Opiates: POSITIVE — AB
TETRAHYDROCANNABINOL: NOT DETECTED

## 2018-02-15 LAB — HIV ANTIBODY (ROUTINE TESTING W REFLEX): HIV SCREEN 4TH GENERATION: NONREACTIVE

## 2018-02-15 LAB — OSMOLALITY: Osmolality: 273 mOsm/kg — ABNORMAL LOW (ref 275–295)

## 2018-02-15 LAB — SALICYLATE LEVEL: Salicylate Lvl: <7 mg/dL (ref 2.8–30.0)

## 2018-02-15 LAB — CORTISOL-AM, BLOOD: CORTISOL - AM: 19.8 ug/dL (ref 6.7–22.6)

## 2018-02-15 LAB — SODIUM, URINE, RANDOM: Sodium, Ur: 51 mmol/L

## 2018-02-15 LAB — I-STAT BETA HCG BLOOD, ED (MC, WL, AP ONLY): I-stat hCG, quantitative: <5 m[IU]/mL (ref <5)

## 2018-02-15 LAB — CBG MONITORING, ED
GLUCOSE-CAPILLARY: 215 mg/dL — AB (ref 65–99)
GLUCOSE-CAPILLARY: 100 mg/dL — AB (ref 65–99)

## 2018-02-15 LAB — PROCALCITONIN

## 2018-02-15 LAB — TSH: TSH: 1.782 u[IU]/mL (ref 0.350–4.500)

## 2018-02-15 LAB — ACETAMINOPHEN LEVEL

## 2018-02-15 LAB — OSMOLALITY, URINE: OSMOLALITY UR: 512 mosm/kg (ref 300–900)

## 2018-02-15 LAB — MAGNESIUM: Magnesium: 1.5 mg/dL — ABNORMAL LOW (ref 1.7–2.4)

## 2018-02-15 MED ORDER — ZOLPIDEM TARTRATE 5 MG PO TABS
5.0000 mg | ORAL_TABLET | Freq: Every evening | ORAL | Status: DC | PRN
  Administered 2018-02-15: 5 mg via ORAL
  Filled 2018-02-15: qty 1

## 2018-02-15 MED ORDER — ACETAMINOPHEN 325 MG PO TABS
650.0000 mg | ORAL_TABLET | Freq: Four times a day (QID) | ORAL | Status: DC | PRN
  Administered 2018-02-15: 650 mg via ORAL
  Filled 2018-02-15: qty 2

## 2018-02-15 MED ORDER — HYDROXYZINE HCL 10 MG PO TABS
10.0000 mg | ORAL_TABLET | Freq: Three times a day (TID) | ORAL | Status: DC | PRN
  Filled 2018-02-15: qty 1

## 2018-02-15 MED ORDER — CLONAZEPAM 0.125 MG PO TBDP
0.2500 mg | ORAL_TABLET | Freq: Every day | ORAL | Status: DC
  Administered 2018-02-16: 0.25 mg via ORAL
  Filled 2018-02-15 (×2): qty 2
  Filled 2018-02-15: qty 1

## 2018-02-15 MED ORDER — KETOROLAC TROMETHAMINE 30 MG/ML IJ SOLN
30.0000 mg | Freq: Once | INTRAMUSCULAR | Status: AC
  Administered 2018-02-15: 30 mg via INTRAVENOUS
  Filled 2018-02-15: qty 1

## 2018-02-15 MED ORDER — CLONAZEPAM 0.5 MG PO TABS: 0.2500 mg | ORAL_TABLET | ORAL | Status: DC

## 2018-02-15 MED ORDER — INSULIN ASPART 100 UNIT/ML ~~LOC~~ SOLN: 0.0000 [IU] | Freq: Every day | SUBCUTANEOUS | Status: DC

## 2018-02-15 MED ORDER — INSULIN ASPART 100 UNIT/ML ~~LOC~~ SOLN
0.0000 [IU] | Freq: Three times a day (TID) | SUBCUTANEOUS | Status: DC
  Administered 2018-02-15: 3 [IU] via SUBCUTANEOUS
  Administered 2018-02-15: 2 [IU] via SUBCUTANEOUS
  Administered 2018-02-16: 1 [IU] via SUBCUTANEOUS
  Filled 2018-02-15: qty 1

## 2018-02-15 MED ORDER — SODIUM CHLORIDE 0.9 % IV BOLUS
1500.0000 mL | Freq: Once | INTRAVENOUS | Status: AC
  Administered 2018-02-15: 1500 mL via INTRAVENOUS

## 2018-02-15 MED ORDER — LURASIDONE HCL 40 MG PO TABS
60.0000 mg | ORAL_TABLET | Freq: Two times a day (BID) | ORAL | Status: DC
  Administered 2018-02-16 (×2): 60 mg via ORAL
  Filled 2018-02-15 (×2): qty 1

## 2018-02-15 MED ORDER — GABAPENTIN 100 MG PO CAPS
100.0000 mg | ORAL_CAPSULE | Freq: Two times a day (BID) | ORAL | Status: DC
  Administered 2018-02-15 – 2018-02-16 (×4): 100 mg via ORAL
  Filled 2018-02-15 (×4): qty 1

## 2018-02-15 MED ORDER — FAMOTIDINE 20 MG PO TABS
20.0000 mg | ORAL_TABLET | Freq: Two times a day (BID) | ORAL | Status: DC
  Administered 2018-02-15 – 2018-02-16 (×3): 20 mg via ORAL
  Filled 2018-02-15 (×3): qty 1

## 2018-02-15 MED ORDER — VITAMIN D 1000 UNITS PO TABS
1000.0000 [IU] | ORAL_TABLET | Freq: Every day | ORAL | Status: DC
  Administered 2018-02-15 – 2018-02-16 (×2): 1000 [IU] via ORAL
  Filled 2018-02-15 (×3): qty 1

## 2018-02-15 MED ORDER — METOCLOPRAMIDE HCL 5 MG/ML IJ SOLN
10.0000 mg | Freq: Once | INTRAMUSCULAR | Status: AC
  Administered 2018-02-15: 10 mg via INTRAVENOUS
  Filled 2018-02-15: qty 2

## 2018-02-15 MED ORDER — LORATADINE 10 MG PO TABS
10.0000 mg | ORAL_TABLET | Freq: Every day | ORAL | Status: DC
  Administered 2018-02-15 – 2018-02-16 (×2): 10 mg via ORAL
  Filled 2018-02-15 (×2): qty 1

## 2018-02-15 MED ORDER — DULOXETINE HCL 30 MG PO CPEP
30.0000 mg | ORAL_CAPSULE | Freq: Every evening | ORAL | Status: DC
  Administered 2018-02-15: 30 mg via ORAL
  Filled 2018-02-15 (×2): qty 1

## 2018-02-15 MED ORDER — MONTELUKAST SODIUM 10 MG PO TABS
10.0000 mg | ORAL_TABLET | Freq: Every day | ORAL | Status: DC
  Administered 2018-02-15: 10 mg via ORAL
  Filled 2018-02-15: qty 1

## 2018-02-15 MED ORDER — DIPHENHYDRAMINE HCL 50 MG/ML IJ SOLN
25.0000 mg | Freq: Once | INTRAMUSCULAR | Status: AC
  Administered 2018-02-15: 25 mg via INTRAVENOUS
  Filled 2018-02-15: qty 1

## 2018-02-15 MED ORDER — ASPIRIN EC 81 MG PO TBEC
81.0000 mg | DELAYED_RELEASE_TABLET | Freq: Every day | ORAL | Status: DC
  Administered 2018-02-15 – 2018-02-16 (×2): 81 mg via ORAL
  Filled 2018-02-15 (×2): qty 1

## 2018-02-15 MED ORDER — ENOXAPARIN SODIUM 40 MG/0.4ML ~~LOC~~ SOLN
40.0000 mg | Freq: Every day | SUBCUTANEOUS | Status: DC
  Administered 2018-02-15: 40 mg via SUBCUTANEOUS
  Filled 2018-02-15 (×2): qty 0.4

## 2018-02-15 MED ORDER — SODIUM CHLORIDE 0.9 % IV BOLUS
1000.0000 mL | Freq: Once | INTRAVENOUS | Status: AC
  Administered 2018-02-15: 1000 mL via INTRAVENOUS

## 2018-02-15 MED ORDER — CYCLOBENZAPRINE HCL 10 MG PO TABS
10.0000 mg | ORAL_TABLET | Freq: Three times a day (TID) | ORAL | Status: DC | PRN
  Administered 2018-02-15 – 2018-02-16 (×2): 10 mg via ORAL
  Filled 2018-02-15 (×2): qty 1

## 2018-02-15 MED ORDER — LORATADINE 10 MG PO TABS: 10.0000 mg | ORAL_TABLET | Freq: Every day | ORAL | Status: DC | PRN

## 2018-02-15 MED ORDER — GABAPENTIN 100 MG PO CAPS
200.0000 mg | ORAL_CAPSULE | Freq: Every day | ORAL | Status: DC
  Administered 2018-02-15: 200 mg via ORAL
  Filled 2018-02-15: qty 2

## 2018-02-15 MED ORDER — DIPHENHYDRAMINE HCL 25 MG PO CAPS
25.0000 mg | ORAL_CAPSULE | Freq: Four times a day (QID) | ORAL | Status: DC | PRN
  Administered 2018-02-15: 25 mg via ORAL
  Filled 2018-02-15: qty 1

## 2018-02-15 MED ORDER — GABAPENTIN 100 MG PO CAPS: 100.0000 mg | ORAL_CAPSULE | Freq: Three times a day (TID) | ORAL | Status: DC

## 2018-02-15 MED ORDER — HYDROCODONE-ACETAMINOPHEN 7.5-325 MG PO TABS
0.5000 | ORAL_TABLET | Freq: Four times a day (QID) | ORAL | Status: DC | PRN
Start: 2018-02-15 — End: 2018-02-16

## 2018-02-15 MED ORDER — MAGNESIUM SULFATE 2 GM/50ML IV SOLN
2.0000 g | Freq: Once | INTRAVENOUS | Status: AC
  Administered 2018-02-15: 2 g via INTRAVENOUS
  Filled 2018-02-15: qty 50

## 2018-02-15 MED ORDER — SODIUM CHLORIDE 0.9 % IV SOLN
INTRAVENOUS | Status: DC
  Administered 2018-02-15 (×2): via INTRAVENOUS

## 2018-02-15 MED ORDER — POTASSIUM CHLORIDE CRYS ER 20 MEQ PO TBCR
40.0000 meq | EXTENDED_RELEASE_TABLET | Freq: Once | ORAL | Status: AC
  Administered 2018-02-15: 40 meq via ORAL
  Filled 2018-02-15: qty 2

## 2018-02-15 MED ORDER — OXCARBAZEPINE 150 MG PO TABS
150.0000 mg | ORAL_TABLET | Freq: Three times a day (TID) | ORAL | Status: DC
  Administered 2018-02-15 – 2018-02-16 (×4): 150 mg via ORAL
  Filled 2018-02-15 (×7): qty 1

## 2018-02-15 MED ORDER — ACETAMINOPHEN 650 MG RE SUPP: 650.0000 mg | Freq: Four times a day (QID) | RECTAL | Status: DC | PRN

## 2018-02-15 MED ORDER — LURASIDONE HCL 40 MG PO TABS
60.0000 mg | ORAL_TABLET | Freq: Two times a day (BID) | ORAL | Status: DC
  Administered 2018-02-15: 15:00:00 60 mg via ORAL
  Filled 2018-02-15 (×2): qty 1

## 2018-02-15 MED ORDER — CLONAZEPAM 0.5 MG PO TABS
0.5000 mg | ORAL_TABLET | Freq: Every day | ORAL | Status: DC | PRN
  Administered 2018-02-15: 0.5 mg via ORAL
  Filled 2018-02-15: qty 1

## 2018-02-15 NOTE — Plan of Care (Signed)
  Problem: Nutrition: Goal: Adequate nutrition will be maintained Outcome: Progressing   Problem: Safety: Goal: Ability to remain free from injury will improve Outcome: Progressing   

## 2018-02-15 NOTE — ED Provider Notes (Signed)
[facility] EMERGENCY DEPARTMENT Provider Note   CSN: [id number] Arrival date & time: 02/15/18  0150     History   Chief Complaint Chief Complaint  Patient presents with  . Near Syncope    HPI Gwendolyn Green is a 52 y.o. female.   The history is provided by the patient and the EMS personnel.   Near Syncope   She has a history of diabetes, hyperlipidemia, bipolar disorder, and was brought in by ambulance because of neck pain and dizziness.  She got up from bed to let her dog out, and she describes a pulling sensation through the back of her neck and upper back and even down into her legs.  There was some lightheadedness.  She laid down, but symptoms did not improve.  Every time she tried to sit up, pain got worse and she got lightheaded.  There was some nausea but no vomiting.  She denies weakness, numbness, tingling.  When symptoms were not resolving after laying on the floor for a considerable amount of time, she finally called for an ambulance.  EMS reported blood pressure 82/50 on arrival which improved following 500 mL bolus of normal saline.  She denied chest pain or dyspnea or diaphoresis.  She has not had symptoms like this before.  She rates her pain a 6/10 currently, but has been as severe as 8/10.  Past Medical History:  Diagnosis Date  . Anxiety   . Bipolar disorder (HCC)   . Depressed   . Fibromyalgia   . GERD (gastroesophageal reflux disease)   . IBS (irritable bowel syndrome)   . PTSD (post-traumatic stress disorder)     Patient Active Problem List   Diagnosis Date Noted  . Chest pain 12/14/2014  . DM (diabetes mellitus) (HCC) 12/14/2014  . Nausea & vomiting 11/24/2011  . Tachycardia 11/24/2011  . Leukocytosis 11/24/2011  . Headache(784.0) 11/24/2011  . Depression 11/24/2011  . Hyperlipidemia 11/24/2011  . Anxiety 11/24/2011  . Fibromyalgia 11/24/2011  . Insomnia 11/24/2011    Past Surgical History:  Procedure Laterality Date  .  CHOLECYSTECTOMY       OB History   None      Home Medications    Prior to Admission medications   Medication Sig Start Date End Date Taking? Authorizing Provider  Cinnamon 500 MG capsule Take 500 mg by mouth 2 (two) times daily.    [provider]  clonazePAM (KLONOPIN) 1 MG tablet Take 1 mg by mouth 4 (four) times daily as needed for anxiety.    [provider]  cyclobenzaprine (FLEXERIL) 10 MG tablet Take 10 mg by mouth 3 (three) times daily as needed for muscle spasms.    [provider]  DULoxetine (CYMBALTA) 30 MG capsule Take 30 mg by mouth every evening.    [provider]  fenofibrate 160 MG tablet Take 1 tablet (160 mg total) by mouth daily. 12/15/14   Mikhail, Nita Sells, DO  lurasidone (LATUDA) 40 MG TABS Take 80 mg by mouth daily with breakfast.     [provider]  metFORMIN (GLUCOPHAGE) 1000 MG tablet Take 1,000 mg by mouth 2 (two) times daily with a meal.    [provider]  OVER THE COUNTER MEDICATION Take 1 tablet by mouth 3 (three) times daily. Coconut oil    [provider]    Family History Family History  Problem Relation Age of Onset  . Bipolar disorder Mother   . Alcohol abuse Father  Social History Social History   Tobacco Use  . Smoking status: Former Games developer  . Smokeless tobacco: Former Engineer, water Use Topics  . Alcohol use: No  . Drug use: No     Allergies   Zofran [ondansetron hcl] and Lamictal [lamotrigine]   Review of Systems Review of Systems  Cardiovascular: Positive for near-syncope.  All other systems reviewed and are negative.    Physical Exam Updated Vital Signs BP 119/80 (BP Location: Right Arm)   Pulse 96   Resp 14   Ht 5' (1.524 m)   Wt 92.1 kg (203 lb)   LMP 02/05/2018 (Exact Date)   SpO2 99%   BMI 39.65 kg/m  normal  Physical Exam  Nursing note and vitals reviewed.  Obese 52 year old female, resting comfortably and in no acute distress. Vital  signs are normal. Oxygen saturation is 99%, which is normal. Head is normocephalic and atraumatic. PERRLA, EOMI. Oropharynx is clear. Neck is nontender and supple without adenopathy or JVD. There are no carotid bruits. Back is nontender and there is no CVA tenderness. Lungs are clear without rales, wheezes, or rhonchi. Chest is nontender. Heart has regular rate and rhythm without murmur. Abdomen is soft, flat, nontender without masses or hepatosplenomegaly and peristalsis is normoactive. Extremities have 1+ edema, full range of motion is present. Skin is warm and dry without rash. Neurologic: Mental status is normal, cranial nerves are intact, there are no motor or sensory deficits.  ED Treatments / Results  Labs (all labs ordered are listed, but only abnormal results are displayed) Labs Reviewed  BASIC METABOLIC PANEL - Abnormal; Notable for the following components:      Result Value   Sodium 127 (*)    Potassium 3.1 (*)    Chloride 94 (*)    CO2 18 (*)    Glucose, Bld 198 (*)    Calcium 8.7 (*)    All other components within normal limits  CBC WITH DIFFERENTIAL/PLATELET - Abnormal; Notable for the following components:   WBC 12.5 (*)    Neutro Abs 7.8 (*)    All other components within normal limits  URINALYSIS, ROUTINE W REFLEX MICROSCOPIC  I-STAT BETA HCG BLOOD, ED ([facility], [facility], [facility] ONLY)  CBG MONITORING, ED    EKG EKG Interpretation  Date/Time:  Wednesday February 15 2018 01:56:15 EDT Ventricular Rate:  91 PR Interval:    QRS Duration: 98 QT Interval:  413 QTC Calculation: 509 R Axis:   95 Text Interpretation:  Sinus rhythm Borderline right axis deviation Borderline prolonged QT interval When compared with ECG of 12/15/2014, QT has lengthened Confirmed by Dione Booze ([id number]) on 02/15/2018 2:03:48 AM   Radiology Ct Cervical Spine Wo Contrast  Result Date: 02/15/2018 CLINICAL DATA:  Dizziness and neck pain. EXAM: CT CERVICAL SPINE WITHOUT CONTRAST TECHNIQUE: Multidetector CT  imaging of the cervical spine was performed without intravenous contrast. Multiplanar CT image reconstructions were also generated. COMPARISON:  None. FINDINGS: Alignment: No static subluxation. Facets are aligned. Occipital condyles and the lateral masses of C1 and C2 are normally approximated. Skull base and vertebrae: No acute fracture. Soft tissues and spinal canal: No prevertebral fluid or swelling. No visible canal hematoma. Disc levels: No advanced spinal canal or neural foraminal stenosis. Upper chest: No pneumothorax, pulmonary nodule or pleural effusion. Other: 1.2 cm soft tissue density focus in the posterior subcutaneous fat at the level of the T1 spinous process is probably a sebaceous cyst or similar entity IMPRESSION: Normal cervical spine. Electronically  Signed   By: Deatra RobinsonKevin  Herman M.D.   On: 02/15/2018 04:17    Procedures Procedures   Medications Ordered in ED Medications  potassium chloride SA (K-DUR,KLOR-CON) CR tablet 40 mEq (has no administration in time range)  sodium chloride 0.9 % bolus 1,000 mL (has no administration in time range)  metoCLOPramide (REGLAN) injection 10 mg (10 mg Intravenous Given 02/15/18 0254)  diphenhydrAMINE (BENADRYL) injection 25 mg (25 mg Intravenous Given 02/15/18 0254)  ketorolac (TORADOL) 30 MG/ML injection 30 mg (30 mg Intravenous Given 02/15/18 0254)     Initial Impression / Assessment and Plan / ED Course  I have reviewed the triage vital signs and the nursing notes.  Pertinent labs & imaging results that were available during my care of the patient were reviewed by me and considered in my medical decision making (see chart for details).  Neck discomfort, dizziness of uncertain cause.  Dizziness and hypotension could conceivably be from a vagal reaction, but she did not have any diaphoresis associated with it.  Exam is unrevealing.  Old records are reviewed, and she had an admission for evaluation of chest pain 3 years ago with negative workup.  She  [facility] and has been told that her heart is fine.  She will be sent for CT of the neck, but I have low index of suspicion of any significant pathology there.  Will check screening labs.  ECG is unchanged other than borderline prolonged QT interval which is not felt to have any clinical significance.  Will give a trial of ketorolac to see if she gets relief from that.  She feels significantly better following above-noted treatment.  CT of the neck is unremarkable.  However, metabolic panel shows metabolic acidosis with borderline anion gap.  She also has hyponatremia and hypokalemia.  Cause for elect light disturbance is not clear.  She is not on any diuretics.  It is also not clear how this relates to her near syncopal episode.  Case is discussed with Dr. Clyde LundborgNiu of [facility], who agrees to admit the patient.  In the meantime, she is given IV fluids and oral potassium.  Final Clinical Impressions(s) / ED Diagnoses   Final diagnoses:  Near syncope  Neck pain  Metabolic acidosis  Hypokalemia  Hyponatremia    ED Discharge Orders    None      Dione BoozeGlick, Taahir Grisby, MD 02/15/18 [phone]

## 2018-02-15 NOTE — ED Notes (Signed)
Service response notified that pt has a bed upstairs. Tray to be re-routed.

## 2018-02-15 NOTE — ED Notes (Signed)
Pharmacy notified.

## 2018-02-15 NOTE — ED Notes (Signed)
Lunch tray ordered by this RN  

## 2018-02-15 NOTE — ED Notes (Signed)
Lab notified of missing med. Klonopin

## 2018-02-15 NOTE — H&P (Signed)
History and Physical    Gwendolyn Green ZOX:[id number] DOB: 06/22/1966 DOA: 02/15/2018  Referring MD/NP/PA:   PCP: Dorene Grebe, MD   Patient coming from:  The patient is coming from home.  At baseline, pt is independent for most of ADL.   Chief Complaint: Near syncope, neck pain,  HPI: Gwendolyn Green is a 52 y.o. female with medical history significant of diabetes mellitus, GERD, depression with anxiety, PTSD, fibromyalgia, bipolar disorder, IBS, who presents with near syncope, neck pain.  Pt states that she woke up around midnight to take dog out, then she became dizzy, and almost passed out, but did not. No LOC. No unilateral weakness, numbness in extremities. No facial droop, slurred speech. Patient states that she began started having neck pain radiating down to her back and posterior legs and to both shoulders. The pain was sharp and initially 8 out of 10 in severity, and currently 4 out of 10 in severity. She denies any injury. Patient does not have chest pain, shortness breath, cough. No fever or chills. Patient has nausea, but no vomiting, diarrhea or abdominal pain. No symptoms of UTI. Per report, pt was hypotensive upon ems arrival with Bp of 82/50 and was given 500cc bolus of ns. Bp came up to 92/61.  ED Course: pt was found to have WBC 12.5, negative pregnancy test, sodium 127, potassium 3.1, bicarbonate 18, anion gap 15, creatinine normal, temperature 97.5, heart rate in 90s, no tachypnea, O2 sat. 99% on room air. CT of C-spine negative for acute abnormalities. Patient is placed on telemetry bed for observation  Review of Systems:   General: no fevers, chills, no body weight gain, has fatigue HEENT: no blurry vision, hearing changes or sore throat Respiratory: no dyspnea, coughing, wheezing CV: no chest pain, no palpitations GI: has nausea, no vomiting, abdominal pain, diarrhea, constipation GU: no dysuria, burning on urination, increased urinary frequency, hematuria  Ext:  no leg edema Neuro: has dizziness and near syncope. Skin: no rash, no skin tear. MSK: has neck pain Heme: No easy bruising.  Travel history: No recent long distant travel.  Allergy:  Allergies  Allergen Reactions  . Zofran [Ondansetron Hcl]     Clawing at skin burning sensation  . Lamictal [Lamotrigine] Rash    Past Medical History:  Diagnosis Date  . Anxiety   . Bipolar disorder (HCC)   . Depressed   . Fibromyalgia   . GERD (gastroesophageal reflux disease)   . IBS (irritable bowel syndrome)   . PTSD (post-traumatic stress disorder)     Past Surgical History:  Procedure Laterality Date  . CHOLECYSTECTOMY      Social History:  reports that she has quit smoking. She has quit using smokeless tobacco. She reports that she does not drink alcohol or use drugs.  Family History:  Family History  Problem Relation Age of Onset  . Bipolar disorder Mother   . Alcohol abuse Father      Prior to Admission medications   Medication Sig Start Date End Date Taking? Authorizing Provider  aspirin EC 81 MG tablet Take 81 mg by mouth daily.   Yes [provider]  cetirizine (ZYRTEC) 10 MG tablet Take 10 mg by mouth daily.   Yes [provider]  cholecalciferol (VITAMIN D) 1000 units tablet Take 1,000 Units by mouth daily.   Yes [provider]  clonazePAM (KLONOPIN) 1 MG tablet Take 0.25-0.5 mg by mouth See admin instructions. Take 0.25 mg daily and 0.5 mg daily as needed for  anxiety   Yes [provider]  cyclobenzaprine (FLEXERIL) 10 MG tablet Take 10 mg by mouth 3 (three) times daily as needed for muscle spasms.   Yes [provider]  DULoxetine (CYMBALTA) 30 MG capsule Take 30 mg by mouth every evening.   Yes [provider]  gabapentin (NEURONTIN) 100 MG capsule Take 100-200 mg by mouth 3 (three) times daily. 100 mg twice daily and 200 mg in the evening   Yes [provider]  HYDROcodone-acetaminophen (NORCO) 7.5-325 MG  tablet Take 0.5-1 tablets by mouth every 6 (six) hours as needed for moderate pain.   Yes [provider]  loratadine (CLARITIN) 10 MG tablet Take 10 mg by mouth daily.   Yes [provider]  lurasidone (LATUDA) 40 MG TABS Take 60 mg by mouth 2 (two) times daily.    Yes [provider]  metFORMIN (GLUCOPHAGE) 1000 MG tablet Take 500 mg by mouth 2 (two) times daily with a meal.    Yes [provider]  montelukast (SINGULAIR) 10 MG tablet Take 10 mg by mouth at bedtime.   Yes [provider]  OXcarbazepine (TRILEPTAL) 150 MG tablet Take 150 mg by mouth 3 (three) times daily.   Yes [provider]  ranitidine (ZANTAC) 150 MG tablet Take 150 mg by mouth 2 (two) times daily.   Yes [provider]    Physical Exam: Vitals:   02/15/18 0215 02/15/18 0300 02/15/18 0415 02/15/18 0430  BP: 100/61 105/64 110/60 118/76  Pulse: 96 99 (!) 103 (!) 108  Resp: 14 13 12 17   SpO2: 100% 93% 98% 100%  Weight:      Height:       General: Not in acute distress HEENT:       Eyes: PERRL, EOMI, no scleral icterus.       ENT: No discharge from the ears and nose, no pharynx injection, no tonsillar enlargement.        Neck: No JVD, no bruit, no mass felt. Heme: No neck lymph node enlargement. Cardiac: S1/S2, RRR, No murmurs, No gallops or rubs. Respiratory: No rales, wheezing, rhonchi or rubs. GI: Soft, nondistended, nontender, no rebound pain, no organomegaly, BS present. GU: No hematuria Ext: No pitting leg edema bilaterally. 2+DP/PT pulse bilaterally. Musculoskeletal: has tenderness in posterior of neck. Skin: No rashes.  Neuro: Alert, oriented X3, cranial nerves II-XII grossly intact, moves all extremities normally. Muscle strength 5/5 in all extremities, sensation to light touch intact. Brachial reflex 2+ bilaterally. Negative Babinski's sign. Normal finger to nose test. Psych: Patient is not psychotic, no suicidal or hemocidal ideation.  Labs  on Admission: I have personally reviewed following labs and imaging studies  CBC: Recent Labs  Lab 02/15/18 0227  WBC 12.5*  NEUTROABS 7.8*  HGB 12.8  HCT 37.1  MCV 88.5  PLT 250   Basic Metabolic Panel: Recent Labs  Lab 02/15/18 0227  NA 127*  K 3.1*  CL 94*  CO2 18*  GLUCOSE 198*  BUN 12  CREATININE 0.84  CALCIUM 8.7*   GFR: Estimated Creatinine Clearance: 82 mL/min (by C-G formula based on SCr of 0.84 mg/dL). Liver Function Tests: No results for input(s): AST, ALT, ALKPHOS, BILITOT, PROT, ALBUMIN in the last 168 hours. No results for input(s): LIPASE, AMYLASE in the last 168 hours. No results for input(s): AMMONIA in the last 168 hours. Coagulation Profile: No results for input(s): INR, PROTIME in the last 168 hours. Cardiac Enzymes: No results for input(s): CKTOTAL, CKMB, CKMBINDEX,  TROPONINI in the last 168 hours. BNP (last 3 results) No results for input(s): PROBNP in the last 8760 hours. HbA1C: No results for input(s): HGBA1C in the last 72 hours. CBG: No results for input(s): GLUCAP in the last 168 hours. Lipid Profile: No results for input(s): CHOL, HDL, LDLCALC, TRIG, CHOLHDL, LDLDIRECT in the last 72 hours. Thyroid Function Tests: No results for input(s): TSH, T4TOTAL, FREET4, T3FREE, THYROIDAB in the last 72 hours. Anemia Panel: No results for input(s): VITAMINB12, FOLATE, FERRITIN, TIBC, IRON, RETICCTPCT in the last 72 hours. Urine analysis:    Component Value Date/Time   COLORURINE YELLOW 03/12/2013 1206   APPEARANCEUR CLEAR 03/12/2013 1206   LABSPEC 1.042 (H) 03/12/2013 1206   PHURINE 6.0 03/12/2013 1206   GLUCOSEU >1000 (A) 03/12/2013 1206   HGBUR NEGATIVE 03/12/2013 1206   BILIRUBINUR NEGATIVE 03/12/2013 1206   KETONESUR 15 (A) 03/12/2013 1206   PROTEINUR NEGATIVE 03/12/2013 1206   UROBILINOGEN 0.2 03/12/2013 1206   NITRITE NEGATIVE 03/12/2013 1206   LEUKOCYTESUR NEGATIVE 03/12/2013 1206   Sepsis  Labs: @LABRCNTIP (procalcitonin:4,lacticidven:4) )No results found for this or any previous visit (from the past 240 hour(s)).   Radiological Exams on Admission: Ct Cervical Spine Wo Contrast  Result Date: 02/15/2018 CLINICAL DATA:  Dizziness and neck pain. EXAM: CT CERVICAL SPINE WITHOUT CONTRAST TECHNIQUE: Multidetector CT imaging of the cervical spine was performed without intravenous contrast. Multiplanar CT image reconstructions were also generated. COMPARISON:  None. FINDINGS: Alignment: No static subluxation. Facets are aligned. Occipital condyles and the lateral masses of C1 and C2 are normally approximated. Skull base and vertebrae: No acute fracture. Soft tissues and spinal canal: No prevertebral fluid or swelling. No visible canal hematoma. Disc levels: No advanced spinal canal or neural foraminal stenosis. Upper chest: No pneumothorax, pulmonary nodule or pleural effusion. Other: 1.2 cm soft tissue density focus in the posterior subcutaneous fat at the level of the T1 spinous process is probably a sebaceous cyst or similar entity IMPRESSION: Normal cervical spine. Electronically Signed   By: Deatra Robinson M.D.   On: 02/15/2018 04:17     EKG: Independently reviewed.  Sinus rhythm, QTC 509, low voltage, RAD, poor R-wave progression  Assessment/Plan Principal Problem:   Near syncope Active Problems:   Fibromyalgia   Diabetes mellitus without complication (HCC)   Hypotension   Neck pain   Hypokalemia   Hyponatremia   SIRS (systemic inflammatory response syndrome) (HCC)   Metabolic acidosis   Near syncope: Likely due to hypotension. The etiology for hypotension is not clear. Patient does not have focal neurologic symptoms of findings on physical examination, less like to have TIA or stroke. Her Bp responded to IV fluid. Currently blood pressure is 118/76.  -will place on tele bed for obs -check orthostatic vital signs -IVF: 2.5 L NS, then 100 cc/h -Frequent neuro check -check  UDS  SIRS vs. Sepsis: Patient has leukocytosis and hypotension, meets criteria for SIRS or sepsis. Source of infection is not clear. No respiratory symptoms. Pending urinalysis. -UA, Ux and Bx -CXR -will get Procalcitonin and trend lactic acid levels per sepsis protocol. -IVF: 2.5 L of NS bolus in ED, followed by 100 cc/h   Hypotension: Etiology is not clear. Will need to r/o adrenal insufficiency. -Rule out sepsis as above -check cortisol level -IV fluid as above  Neck pain: Ct-c spin negative. Likely due to fibromyalgia -Continue home gabapentin and Norco  Psych issues (Depression, anxiety, PTSD, bipolar): Stable, no suicidal or homicidal ideations. -Continue home medications: Klonopin, Cymbalta, Latuda  and Trileptal  Diabetes mellitus without complication (HCC): Last A1c 6.4 on 12/14/14, well controled. Patient is taking metformin at home -SSI  Hypokalemia: K= 3.1  on admission. - Repleted - Check Mg level  Hyponatremia: Sodium 12/05/2015. Mental status is normal. Likely due to side effects of psych medications. - Will check urine sodium, urine osmolality, serum osmolality. - check TSH and cortisol level - Fluid restriction - IVF: as above - f/u BMP  Metabolic acidosis: bicarbonate 18. Anion gap normal. Etiology is not clear. -Follow-up lactic acid level -Check Tylenol level, salicylate level   DVT ppx: SQ Lovenox Code Status: Full code Family Communication: None at bed side.   Disposition Plan:  Anticipate discharge back to previous home environment Consults called:  none Admission status: Obs / tele     Date of Service 02/15/2018    Lorretta Harp [facility] Pager [phone]  If 7PM-7AM, please contact night-coverage [email or web address] Password [email or web address] 02/15/2018, 5:12 AM

## 2018-02-15 NOTE — Progress Notes (Signed)
Patient admitted after midnight, please see H&P.   Labs ordered for AM to follow Na. Patient is on multiple psych meds but denies excessive water drinking/thirst.  She says her Na has been low on and off for years.  No source of infection.  Monitor on tele and continue IVF  [name] DO

## 2018-02-15 NOTE — ED Triage Notes (Signed)
Pt woke up around midnight to take dog out pt became dizzy had a near syncipal episode. Pt then began to have neck pain that radiated down her back. Currently the pain is to her shoulder. Pt denise any cp, or LOC. Pt was hypotensive upon ems arrival 82/50 and was given 500cc bolus of ns and bp came up to 92/61.

## 2018-02-16 DIAGNOSIS — E871 Hypo-osmolality and hyponatremia: Secondary | ICD-10-CM | POA: Diagnosis not present

## 2018-02-16 DIAGNOSIS — E119 Type 2 diabetes mellitus without complications: Secondary | ICD-10-CM | POA: Diagnosis not present

## 2018-02-16 DIAGNOSIS — E876 Hypokalemia: Secondary | ICD-10-CM | POA: Diagnosis not present

## 2018-02-16 DIAGNOSIS — I959 Hypotension, unspecified: Secondary | ICD-10-CM | POA: Diagnosis not present

## 2018-02-16 DIAGNOSIS — R55 Syncope and collapse: Secondary | ICD-10-CM | POA: Diagnosis not present

## 2018-02-16 LAB — CBC
HEMATOCRIT: 37.8 % (ref 36.0–46.0)
Hemoglobin: 12.9 g/dL (ref 12.0–15.0)
MCH: 31.3 pg (ref 26.0–34.0)
MCHC: 34.1 g/dL (ref 30.0–36.0)
MCV: 91.7 fL (ref 78.0–100.0)
PLATELETS: 313 10*3/uL (ref 150–400)
RBC: 4.12 MIL/uL (ref 3.87–5.11)
RDW: 12.9 % (ref 11.5–15.5)
WBC: 7.8 10*3/uL (ref 4.0–10.5)

## 2018-02-16 LAB — GLUCOSE, CAPILLARY
GLUCOSE-CAPILLARY: 111 mg/dL — AB (ref 65–99)
Glucose-Capillary: 148 mg/dL — ABNORMAL HIGH (ref 65–99)

## 2018-02-16 LAB — BASIC METABOLIC PANEL
ANION GAP: 10 (ref 5–15)
BUN: 10 mg/dL (ref 6–20)
CALCIUM: 8.8 mg/dL — AB (ref 8.9–10.3)
CO2: 22 mmol/L (ref 22–32)
Chloride: 104 mmol/L (ref 101–111)
Creatinine, Ser: 0.76 mg/dL (ref 0.44–1.00)
GFR calc non Af Amer: >60 mL/min (ref >60)
Glucose, Bld: 148 mg/dL — ABNORMAL HIGH (ref 65–99)
Potassium: 4.1 mmol/L (ref 3.5–5.1)
Sodium: 136 mmol/L (ref 135–145)

## 2018-02-16 LAB — URINE CULTURE

## 2018-02-16 NOTE — Discharge Summary (Signed)
Physician Discharge Summary  Gwendolyn Green ZOX:[id number] DOB: 11-18-1965 DOA: 02/15/2018  PCP: Dorene GrebeSekhon, Manharprett, MD  Admit date: 02/15/2018 Discharge date: 02/16/2018  Admitted From: home  Disposition:  home   Recommendations for Outpatient Follow-up:  1. F/u sodium level next week- FYI, the patient has told me she takes some over the counter sodium tabs when she feels sodium is low   Discharge Condition:  stable   CODE STATUS:  Full code   Consultations:  none    Discharge Diagnoses:  Principal Problem:   Near syncope Active Problems:   Fibromyalgia   Diabetes mellitus without complication (HCC)   Hypotension   Neck pain   Hypokalemia   Hyponatremia   SIRS (systemic inflammatory response syndrome) (HCC)   Metabolic acidosis       HPI by Dr Clyde LundborgNiu: Gwendolyn Green is a 52 y.o. female with medical history significant of diabetes mellitus, GERD, depression with anxiety, PTSD, fibromyalgia, bipolar disorder, IBS, who presents with near syncope, neck pain.  Pt states that she woke up around midnight to take dog out, then she became dizzy, and almost passed out, but did not. No LOC. No unilateral weakness, numbness in extremities. No facial droop, slurred speech. Patient states that she began started having neck pain radiating down to her back and posterior legs and to both shoulders. The pain was sharp and initially 8 out of 10 in severity, and currently 4 out of 10 in severity. She denies any injury. Patient does not have chest pain, shortness breath, cough. No fever or chills. Patient has nausea, but no vomiting, diarrhea or abdominal pain. No symptoms of UTI. Per report, pt was hypotensive upon ems arrival with Bp of 82/50 and was given 500cc bolus of ns. Bp came up to 92/61.   Hospital Course:  Hyponatremia/ near syncope/ hypotension/ acidosis - ? Dehydration- given > 1.5 L of NS- SIADH would not correct with fluids -  - sodium corrected from 126 to 136 with saline infusions-  see bmet below showing improvement - she has been ambulating in the hospital and symptoms have not recurred- I have discussed following up with her PCP in 1 wk - she agrees with this-    Discharge Exam: Vitals:   02/16/18 0010 02/16/18 0551  BP: (!) 146/88 (!) 143/83  Pulse: (!) 106 94  Resp: 18 18  Temp: (!) 97.5 F (36.4 C) (!) 97.4 F (36.3 C)  SpO2: 98% 93%   Vitals:   02/15/18 1407 02/15/18 2004 02/16/18 0010 02/16/18 0551  BP: 135/84 (!) 145/86 (!) 146/88 (!) 143/83  Pulse: 96 99 (!) 106 94  Resp: 18 18 18 18   Temp: 98.2 F (36.8 C) 97.8 F (36.6 C) (!) 97.5 F (36.4 C) (!) 97.4 F (36.3 C)  TempSrc: Oral Oral Oral Oral  SpO2: 98% 99% 98% 93%  Weight: 96.7 kg (213 lb 1.6 oz)   95.3 kg (210 lb)  Height: 5' (1.524 m)       General: Pt is alert, awake, not in acute distress Cardiovascular: RRR, S1/S2 +, no rubs, no gallops Respiratory: CTA bilaterally, no wheezing, no rhonchi Abdominal: Soft, NT, ND, bowel sounds + Extremities: no edema, no cyanosis   Discharge Instructions  Discharge Instructions    Diet general   Complete by:  As directed    Increase activity slowly   Complete by:  As directed      Allergies as of 02/16/2018      Reactions   Zofran [ondansetron Hcl]  Clawing at skin burning sensation   Lamictal [lamotrigine] Rash      Medication List    TAKE these medications   aspirin EC 81 MG tablet Take 81 mg by mouth daily.   cetirizine 10 MG tablet Commonly known as:  ZYRTEC Take 10 mg by mouth daily.   cholecalciferol 1000 units tablet Commonly known as:  VITAMIN D Take 1,000 Units by mouth daily.   clonazePAM 1 MG tablet Commonly known as:  KLONOPIN Take 0.25-0.5 mg by mouth See admin instructions. Take 0.25 mg daily and 0.5 mg daily as needed for anxiety   cyclobenzaprine 10 MG tablet Commonly known as:  FLEXERIL Take 10 mg by mouth 3 (three) times daily as needed for muscle spasms.   DULoxetine 30 MG capsule Commonly known as:   CYMBALTA Take 30 mg by mouth every evening.   gabapentin 100 MG capsule Commonly known as:  NEURONTIN Take 100-200 mg by mouth 3 (three) times daily. 100 mg twice daily and 200 mg in the evening   HYDROcodone-acetaminophen 7.5-325 MG tablet Commonly known as:  NORCO Take 0.5-1 tablets by mouth every 6 (six) hours as needed for moderate pain.   levothyroxine 25 MCG tablet Commonly known as:  SYNTHROID, LEVOTHROID Take 25 mcg by mouth daily before breakfast.   loratadine 10 MG tablet Commonly known as:  CLARITIN Take 10 mg by mouth daily.   lurasidone 40 MG Tabs tablet Commonly known as:  LATUDA Take 60 mg by mouth 2 (two) times daily.   metFORMIN 1000 MG tablet Commonly known as:  GLUCOPHAGE Take 500 mg by mouth 2 (two) times daily with a meal.   montelukast 10 MG tablet Commonly known as:  SINGULAIR Take 10 mg by mouth at bedtime.   OXcarbazepine 150 MG tablet Commonly known as:  TRILEPTAL Take 150 mg by mouth 3 (three) times daily.   ranitidine 150 MG tablet Commonly known as:  ZANTAC Take 150 mg by mouth 2 (two) times daily.      Follow-up Information    Dorene Grebe, MD. Schedule an appointment as soon as possible for a visit in 1 week(s).   Specialty:  Internal Medicine Why:  please discuss your sodium pills that you have at home. PCP will decide if you need more blood work. Contact information: [address] [place] [address] [phone]          Allergies  Allergen Reactions  . Zofran [Ondansetron Hcl]     Clawing at skin burning sensation  . Lamictal [Lamotrigine] Rash     Procedures/Studies:    Dg Chest 1 View  Result Date: 02/15/2018 CLINICAL DATA:  Arterial hypotension. History of leukocytosis and hypotension. EXAM: CHEST  1 VIEW COMPARISON:  12/14/2014 FINDINGS: The heart size and mediastinal contours are within normal limits. Both lungs are clear. The visualized skeletal structures are unremarkable. IMPRESSION: No active  disease. Electronically Signed   By: Burman Nieves M.D.   On: 02/15/2018 06:12   Ct Cervical Spine Wo Contrast  Result Date: 02/15/2018 CLINICAL DATA:  Dizziness and neck pain. EXAM: CT CERVICAL SPINE WITHOUT CONTRAST TECHNIQUE: Multidetector CT imaging of the cervical spine was performed without intravenous contrast. Multiplanar CT image reconstructions were also generated. COMPARISON:  None. FINDINGS: Alignment: No static subluxation. Facets are aligned. Occipital condyles and the lateral masses of C1 and C2 are normally approximated. Skull base and vertebrae: No acute fracture. Soft tissues and spinal canal: No prevertebral fluid or swelling. No visible canal hematoma. Disc levels: No  advanced spinal canal or neural foraminal stenosis. Upper chest: No pneumothorax, pulmonary nodule or pleural effusion. Other: 1.2 cm soft tissue density focus in the posterior subcutaneous fat at the level of the T1 spinous process is probably a sebaceous cyst or similar entity IMPRESSION: Normal cervical spine. Electronically Signed   By: Deatra Robinson M.D.   On: 02/15/2018 04:17      The results of significant diagnostics from this hospitalization (including imaging, microbiology, ancillary and laboratory) are listed below for reference.     Microbiology: Recent Results (from the past 240 hour(s))  Culture, blood (Routine X 2) w Reflex to ID Panel     Status: None (Preliminary result)   Collection Time: 02/15/18  4:35 AM  Result Value Ref Range Status   Specimen Description BLOOD RIGHT ARM  Final   Special Requests   Final    BOTTLES DRAWN AEROBIC AND ANAEROBIC Blood Culture results may not be optimal due to an excessive volume of blood received in culture bottles   Culture   Final    NO GROWTH 1 DAY Performed at [facility], [address], [place], [place] [address]    Report Status PENDING  Incomplete  Culture, blood (Routine X 2) w Reflex to ID Panel     Status: None (Preliminary result)    Collection Time: 02/15/18  5:12 AM  Result Value Ref Range Status   Specimen Description BLOOD LEFT HAND  Final   Special Requests IN PEDIATRIC BOTTLE Blood Culture adequate volume  Final   Culture   Final    NO GROWTH 1 DAY Performed at [facility], [address], [place], [place] [address]    Report Status PENDING  Incomplete  Urine Culture     Status: Abnormal   Collection Time: 02/15/18  6:17 AM  Result Value Ref Range Status   Specimen Description URINE, RANDOM  Final   Special Requests   Final    NONE Performed at [facility], [address] [place], [place] [address]    Culture MULTIPLE SPECIES PRESENT, SUGGEST RECOLLECTION (A)  Final   Report Status 02/16/2018 FINAL  Final     Labs: BNP (last 3 results) No results for input(s): BNP in the last 8760 hours. Basic Metabolic Panel: Recent Labs  Lab 02/15/18 0227 02/15/18 0509 02/16/18 0454  NA 127* 126* 136  K 3.1* 3.8 4.1  CL 94* 91* 104  CO2 18* 19* 22  GLUCOSE 198* 204* 148*  BUN 12 10 10   CREATININE 0.84 0.84 0.76  CALCIUM 8.7* 8.7* 8.8*  MG  --  1.5*  --    Liver Function Tests: No results for input(s): AST, ALT, ALKPHOS, BILITOT, PROT, ALBUMIN in the last 168 hours. No results for input(s): LIPASE, AMYLASE in the last 168 hours. No results for input(s): AMMONIA in the last 168 hours. CBC: Recent Labs  Lab 02/15/18 0227 02/15/18 0509 02/16/18 0454  WBC 12.5* 14.0* 7.8  NEUTROABS 7.8*  --   --   HGB 12.8 12.7 12.9  HCT 37.1 36.9 37.8  MCV 88.5 88.5 91.7  PLT 250 287 313   Cardiac Enzymes: No results for input(s): CKTOTAL, CKMB, CKMBINDEX, TROPONINI in the last 168 hours. BNP: Invalid input(s): POCBNP CBG: Recent Labs  Lab 02/15/18 0820 02/15/18 1329 02/15/18 1653 02/15/18 2055 02/16/18 0804  GLUCAP 215* 100* 154* 152* 148*   D-Dimer No results for input(s): DDIMER in the last 72 hours. Hgb A1c No results for  input(s): HGBA1C in the last 72 hours. Lipid Profile No  results for input(s): CHOL, HDL, LDLCALC, TRIG, CHOLHDL, LDLDIRECT in the last 72 hours. Thyroid function studies Recent Labs    02/15/18 0449  TSH 1.782   Anemia work up No results for input(s): VITAMINB12, FOLATE, FERRITIN, TIBC, IRON, RETICCTPCT in the last 72 hours. Urinalysis    Component Value Date/Time   COLORURINE YELLOW 02/15/2018 0621   APPEARANCEUR CLEAR 02/15/2018 0621   LABSPEC 1.018 02/15/2018 0621   PHURINE 5.0 02/15/2018 0621   GLUCOSEU NEGATIVE 02/15/2018 0621   HGBUR NEGATIVE 02/15/2018 0621   BILIRUBINUR NEGATIVE 02/15/2018 0621   KETONESUR 5 (A) 02/15/2018 0621   PROTEINUR NEGATIVE 02/15/2018 0621   UROBILINOGEN 0.2 03/12/2013 1206   NITRITE NEGATIVE 02/15/2018 0621   LEUKOCYTESUR NEGATIVE 02/15/2018 1610   Sepsis Labs Invalid input(s): PROCALCITONIN,  WBC,  LACTICIDVEN Microbiology Recent Results (from the past 240 hour(s))  Culture, blood (Routine X 2) w Reflex to ID Panel     Status: None (Preliminary result)   Collection Time: 02/15/18  4:35 AM  Result Value Ref Range Status   Specimen Description BLOOD RIGHT ARM  Final   Special Requests   Final    BOTTLES DRAWN AEROBIC AND ANAEROBIC Blood Culture results may not be optimal due to an excessive volume of blood received in culture bottles   Culture   Final    NO GROWTH 1 DAY Performed at [facility], [address], [place], [place] [address]    Report Status PENDING  Incomplete  Culture, blood (Routine X 2) w Reflex to ID Panel     Status: None (Preliminary result)   Collection Time: 02/15/18  5:12 AM  Result Value Ref Range Status   Specimen Description BLOOD LEFT HAND  Final   Special Requests IN PEDIATRIC BOTTLE Blood Culture adequate volume  Final   Culture   Final    NO GROWTH 1 DAY Performed at [facility], [address], [place], [place] [address]    Report Status PENDING  Incomplete  Urine Culture     Status: Abnormal   Collection Time: 02/15/18  6:17 AM  Result Value  Ref Range Status   Specimen Description URINE, RANDOM  Final   Special Requests   Final    NONE Performed at [facility], [address] [place], [place] [address]    Culture MULTIPLE SPECIES PRESENT, SUGGEST RECOLLECTION (A)  Final   Report Status 02/16/2018 FINAL  Final     Time coordinating discharge: Over 30 minutes  SIGNED:   Calvert Cantor, MD  [facility] 02/16/2018, 9:33 AM Pager   If 7PM-7AM, please contact night-coverage [email or web address] Password [email or web address]

## 2018-02-20 LAB — CULTURE, BLOOD (ROUTINE X 2)
CULTURE: NO GROWTH
CULTURE: NO GROWTH
SPECIAL REQUESTS: ADEQUATE

## 2018-06-08 ENCOUNTER — Other Ambulatory Visit: Payer: Self-pay

## 2018-06-08 ENCOUNTER — Encounter (HOSPITAL_COMMUNITY): Payer: Self-pay

## 2018-06-08 ENCOUNTER — Emergency Department (HOSPITAL_COMMUNITY)
Admission: EM | Admit: 2018-06-08 | Discharge: 2018-06-08 | Discharge status: Against Medical Advice | Payer: Medicare HMO | Attending: Emergency Medicine | Admitting: Emergency Medicine

## 2018-06-08 DIAGNOSIS — Z5321 Procedure and treatment not carried out due to patient leaving prior to being seen by health care provider: Secondary | ICD-10-CM | POA: Diagnosis not present

## 2018-06-08 DIAGNOSIS — R1013 Epigastric pain: Secondary | ICD-10-CM | POA: Diagnosis not present

## 2018-06-08 LAB — COMPREHENSIVE METABOLIC PANEL
ALK PHOS: 84 U/L (ref 38–126)
ALT: 19 U/L (ref 0–44)
AST: 16 U/L (ref 15–41)
Albumin: 3.8 g/dL (ref 3.5–5.0)
Anion gap: 10 (ref 5–15)
BUN: 13 mg/dL (ref 6–20)
CO2: 24 mmol/L (ref 22–32)
Calcium: 9.6 mg/dL (ref 8.9–10.3)
Chloride: 102 mmol/L (ref 98–111)
Creatinine, Ser: 0.75 mg/dL (ref 0.44–1.00)
Glucose, Bld: 127 mg/dL — ABNORMAL HIGH (ref 70–99)
Potassium: 4.1 mmol/L (ref 3.5–5.1)
Sodium: 136 mmol/L (ref 135–145)
Total Bilirubin: 0.5 mg/dL (ref 0.3–1.2)
Total Protein: 7.2 g/dL (ref 6.5–8.1)

## 2018-06-08 LAB — CBC
HCT: 40.8 % (ref 36.0–46.0)
Hemoglobin: 13.9 g/dL (ref 12.0–15.0)
MCH: 31.2 pg (ref 26.0–34.0)
MCHC: 34.1 g/dL (ref 30.0–36.0)
MCV: 91.5 fL (ref 78.0–100.0)
Platelets: 354 10*3/uL (ref 150–400)
RBC: 4.46 MIL/uL (ref 3.87–5.11)
RDW: 12.1 % (ref 11.5–15.5)
WBC: 14.2 10*3/uL — ABNORMAL HIGH (ref 4.0–10.5)

## 2018-06-08 LAB — I-STAT BETA HCG BLOOD, ED (MC, WL, AP ONLY): I-stat hCG, quantitative: <5 m[IU]/mL (ref <5)

## 2018-06-08 LAB — LIPASE, BLOOD: Lipase: 35 U/L (ref 11–51)

## 2018-06-08 NOTE — ED Triage Notes (Signed)
Pt presents with 2 hour onset of epigastric pain that radiates to both sides of abdomen and into R side of neck and shoulder.  Pt denies any nausea or shortness of breath, reports pain has been constant since onset.

## 2018-06-08 NOTE — ED Notes (Signed)
Pt came to front desk and stated "I am feeling much better" Pt asked about ekg and told that the dr is the only one who can give results. Pt stated "I am gonna go ahead and go home" Pt encouraged to stay multiple times but did not. Pt observed to be ambulating out the door and appeared to be in NAD.

## 2023-03-01 ENCOUNTER — Emergency Department (HOSPITAL_COMMUNITY)
Admission: EM | Admit: 2023-03-01 | Discharge: 2023-03-02 | Disposition: A | Discharge status: Home / Self Care | Payer: No Typology Code available for payment source | Attending: Emergency Medicine | Admitting: Emergency Medicine

## 2023-03-01 ENCOUNTER — Encounter (HOSPITAL_COMMUNITY): Payer: Self-pay

## 2023-03-01 ENCOUNTER — Emergency Department (HOSPITAL_COMMUNITY): Payer: No Typology Code available for payment source

## 2023-03-01 ENCOUNTER — Other Ambulatory Visit: Payer: Self-pay

## 2023-03-01 DIAGNOSIS — E119 Type 2 diabetes mellitus without complications: Secondary | ICD-10-CM | POA: Diagnosis not present

## 2023-03-01 DIAGNOSIS — R404 Transient alteration of awareness: Secondary | ICD-10-CM | POA: Diagnosis not present

## 2023-03-01 DIAGNOSIS — R4182 Altered mental status, unspecified: Secondary | ICD-10-CM | POA: Diagnosis present

## 2023-03-01 DIAGNOSIS — Z7984 Long term (current) use of oral hypoglycemic drugs: Secondary | ICD-10-CM | POA: Insufficient documentation

## 2023-03-01 LAB — CBC
HCT: 41.1 % (ref 36.0–46.0)
Hemoglobin: 14.3 g/dL (ref 12.0–15.0)
MCH: 31.1 pg (ref 26.0–34.0)
MCHC: 34.8 g/dL (ref 30.0–36.0)
MCV: 89.3 fL (ref 80.0–100.0)
Platelets: 357 10*3/uL (ref 150–400)
RBC: 4.6 MIL/uL (ref 3.87–5.11)
RDW: 12.2 % (ref 11.5–15.5)
WBC: 14.6 10*3/uL — ABNORMAL HIGH (ref 4.0–10.5)
nRBC: 0 % (ref 0.0–0.2)

## 2023-03-01 LAB — BASIC METABOLIC PANEL
Anion gap: 11 (ref 5–15)
BUN: 15 mg/dL (ref 6–20)
CO2: 19 mmol/L — ABNORMAL LOW (ref 22–32)
Calcium: 9.8 mg/dL (ref 8.9–10.3)
Chloride: 106 mmol/L (ref 98–111)
Creatinine, Ser: 0.98 mg/dL (ref 0.44–1.00)
GFR, Estimated: >60 mL/min (ref >60)
Glucose, Bld: 208 mg/dL — ABNORMAL HIGH (ref 70–99)
Potassium: 3.8 mmol/L (ref 3.5–5.1)
Sodium: 136 mmol/L (ref 135–145)

## 2023-03-01 LAB — DIFFERENTIAL
Abs Immature Granulocytes: 0.07 10*3/uL (ref 0.00–0.07)
Basophils Absolute: 0.1 10*3/uL (ref 0.0–0.1)
Basophils Relative: 0 %
Eosinophils Absolute: 0.3 10*3/uL (ref 0.0–0.5)
Eosinophils Relative: 2 %
Immature Granulocytes: 1 %
Lymphocytes Relative: 35 %
Lymphs Abs: 5 10*3/uL — ABNORMAL HIGH (ref 0.7–4.0)
Monocytes Absolute: 1.1 10*3/uL — ABNORMAL HIGH (ref 0.1–1.0)
Monocytes Relative: 8 %
Neutro Abs: 7.8 10*3/uL — ABNORMAL HIGH (ref 1.7–7.7)
Neutrophils Relative %: 54 %

## 2023-03-01 LAB — PROTIME-INR
INR: 1 (ref 0.8–1.2)
Prothrombin Time: 12.7 seconds (ref 11.4–15.2)

## 2023-03-01 LAB — ETHANOL: Alcohol, Ethyl (B): <10 mg/dL (ref <10)

## 2023-03-01 LAB — APTT: aPTT: 29 seconds (ref 24–36)

## 2023-03-01 MED ORDER — SODIUM CHLORIDE 0.9 % IV SOLN: 1000.0000 mL | INTRAVENOUS | Status: DC

## 2023-03-01 MED ORDER — SODIUM CHLORIDE 0.9 % IV BOLUS (SEPSIS)
1000.0000 mL | Freq: Once | INTRAVENOUS | Status: AC
  Administered 2023-03-02: 1000 mL via INTRAVENOUS

## 2023-03-01 NOTE — ED Provider Notes (Signed)
[place] [facility] AT [facility] Provider Note  CSN: [id number] Arrival date & time: 03/01/23 2144  Chief Complaint(s) Altered Mental Status  HPI Gwendolyn Green is a 57 y.o. female {Add pertinent medical, surgical, social history, OB history to HPI:1}    Altered Mental Status   Past Medical History Past Medical History:  Diagnosis Date   Anxiety    Bipolar disorder    Depressed    Fibromyalgia    GERD (gastroesophageal reflux disease)    IBS (irritable bowel syndrome)    PTSD (post-traumatic stress disorder)    Patient Active Problem List   Diagnosis Date Noted   Hypotension 02/15/2018   Near syncope 02/15/2018   Neck pain 02/15/2018   Hypokalemia 02/15/2018   Hyponatremia 02/15/2018   SIRS (systemic inflammatory response syndrome) 02/15/2018   Metabolic acidosis    Chest pain 12/14/2014   Diabetes mellitus without complication (HCC) 12/14/2014   Nausea & vomiting 11/24/2011   Tachycardia 11/24/2011   Leukocytosis 11/24/2011   Headache(784.0) 11/24/2011   Depression 11/24/2011   Hyperlipidemia 11/24/2011   Anxiety 11/24/2011   Fibromyalgia 11/24/2011   Insomnia 11/24/2011   Home Medication(s) Prior to Admission medications   Medication Sig Start Date End Date Taking? Authorizing Provider  aspirin EC 81 MG tablet Take 81 mg by mouth daily.    [provider]  cetirizine (ZYRTEC) 10 MG tablet Take 10 mg by mouth daily.    [provider]  cholecalciferol (VITAMIN D) 1000 units tablet Take 1,000 Units by mouth daily.    [provider]  clonazePAM (KLONOPIN) 1 MG tablet Take 0.25-0.5 mg by mouth See admin instructions. Take 0.25 mg daily and 0.5 mg daily as needed for anxiety    [provider]  cyclobenzaprine (FLEXERIL) 10 MG tablet Take 10 mg by mouth 3 (three) times daily as needed for muscle spasms.    [provider]  DULoxetine (CYMBALTA) 30 MG capsule Take 30 mg by mouth every  evening.    [provider]  gabapentin (NEURONTIN) 100 MG capsule Take 100-200 mg by mouth 3 (three) times daily. 100 mg twice daily and 200 mg in the evening    [provider]  HYDROcodone-acetaminophen (NORCO) 7.5-325 MG tablet Take 0.5-1 tablets by mouth every 6 (six) hours as needed for moderate pain.    [provider]  levothyroxine (SYNTHROID, LEVOTHROID) 25 MCG tablet Take 25 mcg by mouth daily before breakfast.    [provider]  loratadine (CLARITIN) 10 MG tablet Take 10 mg by mouth daily.    [provider]  lurasidone (LATUDA) 40 MG TABS Take 60 mg by mouth 2 (two) times daily.     [provider]  metFORMIN (GLUCOPHAGE) 1000 MG tablet Take 500 mg by mouth 2 (two) times daily with a meal.     [provider]  montelukast (SINGULAIR) 10 MG tablet Take 10 mg by mouth at bedtime.    [provider]  OXcarbazepine (TRILEPTAL) 150 MG tablet Take 150 mg by mouth 3 (three) times daily.    [provider]  ranitidine (ZANTAC) 150 MG tablet Take 150 mg by mouth 2 (two) times daily.    [provider]  Allergies Ondansetron, Ondansetron hcl, and Lamictal [lamotrigine]  Review of Systems Review of Systems As noted in HPI  Physical Exam Vital Signs  I have reviewed the triage vital signs BP 129/85   Pulse (!) 138   Temp 98 F (36.7 C) (Oral)   Resp 15   Ht 5' (1.524 m)   Wt 81.6 kg   SpO2 100%   BMI 35.15 kg/m  *** Physical Exam  ED Results and Treatments Labs (all labs ordered are listed, but only abnormal results are displayed) Labs Reviewed  CBC - Abnormal; Notable for the following components:      Result Value   WBC 14.6 (*)    All other components within normal limits  BASIC METABOLIC PANEL - Abnormal; Notable for the following components:   CO2 19  (*)    Glucose, Bld 208 (*)    All other components within normal limits  RAPID URINE DRUG SCREEN, HOSP PERFORMED  ETHANOL  PROTIME-INR  APTT  DIFFERENTIAL  HEPATIC FUNCTION PANEL  BLOOD GAS, VENOUS  URINALYSIS, W/ REFLEX TO CULTURE (INFECTION SUSPECTED)  I-STAT CHEM 8, ED                                                                                                                         EKG  EKG Interpretation  Date/Time:  Tuesday March 01 2023 22:15:47 EDT Ventricular Rate:  126 PR Interval:  143 QRS Duration: 84 QT Interval:  314 QTC Calculation: 455 R Axis:   85 Text Interpretation: Sinus tachycardia Probable anteroseptal infarct, old similar to 2019 Confirmed by Pricilla Loveless [phone]) on 03/01/2023 10:17:15 PM       Radiology No results found.  Medications Ordered in ED Medications  sodium chloride 0.9 % bolus 1,000 mL (has no administration in time range)    Followed by  0.9 %  sodium chloride infusion (has no administration in time range)                                                                                                                                     Procedures Procedures  (including critical care time)  Medical Decision Making / ED Course  Click here for ABCD2, HEART and other calculators  Medical Decision Making         Final Clinical Impression(s) / ED Diagnoses Final diagnoses:  None    {Document critical care time  when appropriate:1}  {Document review of labs and clinical decision tools ie heart score, Chads2Vasc2 etc:1}  {Document your independent review of radiology images, and any outside records:1} {Document your discussion with family members, caretakers, and with consultants:1} {Document social determinants of health affecting pt's care:1} {Document your decision making why or why not admission, treatments were needed:1} This chart was dictated using voice recognition software.  Despite best efforts to proofread,   errors can occur which can change the documentation meaning.

## 2023-03-01 NOTE — ED Provider Triage Note (Signed)
Emergency Medicine Provider Triage Evaluation Note  Gwendolyn Green , a 57 y.o. female  was evaluated in triage.  Pt complains of time distortion, confusion.  Patient reports that tonight around 9 she began developing time issues.  Patient states that she is having a hard time telling periods of time.  Patient reports that she is unable to tell hours between minutes.  Patient goes on to state that she is on the phone with her friend and there will be periods of time where the patient forgets what she is saying.  Patient denies any one-sided weakness or numbness.  Patient noted to be tachycardic in triage, denies drug use.  Patient with history of bipolar disorder, denies taking extra doses of medication.  Patient reports compliance on medications.  Patient denies chest pain or shortness of breath, nausea, vomiting, diarrhea, abdominal pain, back pain.  Patient complaining of dry mouth.  Review of Systems  Positive:  Negative:   Physical Exam  BP (!) 159/100   Pulse (!) 138   Temp 98 F (36.7 C) (Oral)   Resp 20   Ht 5' (1.524 m)   Wt 81.6 kg   SpO2 100%   BMI 35.15 kg/m  Gen:   Awake, no distress   Resp:  Normal effort  MSK:   Moves extremities without difficulty  Other:    Medical Decision Making  Medically screening exam initiated at 10:05 PM.  Appropriate orders placed.  Novella Rob was informed that the remainder of the evaluation will be completed by another provider, this initial triage assessment does not replace that evaluation, and the importance of remaining in the ED until their evaluation is complete.     Al Decant, PA-C 03/01/23 2206

## 2023-03-01 NOTE — ED Triage Notes (Signed)
Arrives EMS from home for a distorted sense of time. Says at 2019, or 2031 tonight while on phone she paused for roughly 30 seconds. Says she is unable to tell how much time has elapsed from seeing people in room.   Hx bipolar disorder, anxiety, and PTSD. Says she has been compliant on all medications.   NIH-0. A/ox4.

## 2023-03-02 LAB — URINALYSIS, W/ REFLEX TO CULTURE (INFECTION SUSPECTED)
Bacteria, UA: NONE SEEN
Bilirubin Urine: NEGATIVE
Glucose, UA: NEGATIVE mg/dL
Hgb urine dipstick: NEGATIVE
Ketones, ur: NEGATIVE mg/dL
Leukocytes,Ua: NEGATIVE
Nitrite: NEGATIVE
Protein, ur: NEGATIVE mg/dL
Specific Gravity, Urine: 1.02 (ref 1.005–1.030)
pH: 6 (ref 5.0–8.0)

## 2023-03-02 LAB — HEPATIC FUNCTION PANEL
ALT: 18 U/L (ref 0–44)
AST: 18 U/L (ref 15–41)
Albumin: 4 g/dL (ref 3.5–5.0)
Alkaline Phosphatase: 65 U/L (ref 38–126)
Bilirubin, Direct: <0.1 mg/dL (ref 0.0–0.2)
Total Bilirubin: 0.4 mg/dL (ref 0.3–1.2)
Total Protein: 7.1 g/dL (ref 6.5–8.1)

## 2023-03-02 LAB — RAPID URINE DRUG SCREEN, HOSP PERFORMED
Amphetamines: NOT DETECTED
Barbiturates: NOT DETECTED
Benzodiazepines: NOT DETECTED
Cocaine: NOT DETECTED
Opiates: NOT DETECTED
Tetrahydrocannabinol: POSITIVE — AB

## 2023-03-02 LAB — T4, FREE: Free T4: 0.89 ng/dL (ref 0.61–1.12)

## 2023-03-02 LAB — BLOOD GAS, VENOUS
Acid-Base Excess: 1.3 mmol/L (ref 0.0–2.0)
Bicarbonate: 25.9 mmol/L (ref 20.0–28.0)
O2 Saturation: 97.6 %
Patient temperature: 37
pCO2, Ven: 40 mmHg — ABNORMAL LOW (ref 44–60)
pH, Ven: 7.42 (ref 7.25–7.43)
pO2, Ven: 80 mmHg — ABNORMAL HIGH (ref 32–45)

## 2023-03-02 LAB — TSH: TSH: 1.059 u[IU]/mL (ref 0.350–4.500)

## 2023-03-02 NOTE — ED Notes (Signed)
RN aware of pt vitals

## 2024-02-13 ENCOUNTER — Other Ambulatory Visit: Payer: Self-pay

## 2024-02-13 ENCOUNTER — Ambulatory Visit
Admission: EM | Admit: 2024-02-13 | Discharge: 2024-02-13 | Disposition: A | Discharge status: Home / Self Care | Attending: Family Medicine | Admitting: Family Medicine

## 2024-02-13 ENCOUNTER — Encounter: Payer: Self-pay | Admitting: Emergency Medicine

## 2024-02-13 DIAGNOSIS — J069 Acute upper respiratory infection, unspecified: Secondary | ICD-10-CM

## 2024-02-13 LAB — POC COVID19/FLU A&B COMBO
Covid Antigen, POC: NEGATIVE
Influenza A Antigen, POC: NEGATIVE
Influenza B Antigen, POC: NEGATIVE

## 2024-02-13 MED ORDER — METHYLPREDNISOLONE 4 MG PO TBPK: ORAL_TABLET | ORAL | 0 refills | Status: AC

## 2024-02-13 MED ORDER — ALBUTEROL SULFATE HFA 108 (90 BASE) MCG/ACT IN AERS: 2.0000 | INHALATION_SPRAY | RESPIRATORY_TRACT | 0 refills | Status: AC | PRN

## 2024-02-13 NOTE — ED Provider Notes (Signed)
 [facility]    CSN: [id number] Arrival date & time: 02/13/24  0911      History   Chief Complaint Chief Complaint  Patient presents with   Sore Throat    HPI Gwendolyn Green is a 58 y.o. female.    Sore Throat Pertinent negatives include no shortness of breath.  Patient is here for URI symptoms x 4 days.  She is having sinus congestion, sinus pressure, sore throat and cough.  No fevers/chills.  No n/v.  She has taken tylenol, delsym, benadryl, muscle relaxer due to all the coughing.  No known sick contacts.        Past Medical History:  Diagnosis Date   Anxiety    Bipolar disorder (HCC)    Depressed    Fibromyalgia    GERD (gastroesophageal reflux disease)    IBS (irritable bowel syndrome)    PTSD (post-traumatic stress disorder)     Patient Active Problem List   Diagnosis Date Noted   Hypotension 02/15/2018   Near syncope 02/15/2018   Neck pain 02/15/2018   Hypokalemia 02/15/2018   Hyponatremia 02/15/2018   SIRS (systemic inflammatory response syndrome) (HCC) 02/15/2018   Metabolic acidosis    Chest pain 12/14/2014   Diabetes mellitus without complication (HCC) 12/14/2014   Nausea & vomiting 11/24/2011   Tachycardia 11/24/2011   Leukocytosis 11/24/2011   Headache 11/24/2011   Depression 11/24/2011   Hyperlipidemia 11/24/2011   Anxiety 11/24/2011   Fibromyalgia 11/24/2011   Insomnia 11/24/2011    Past Surgical History:  Procedure Laterality Date   CHOLECYSTECTOMY      OB History   No obstetric history on file.      Home Medications    Prior to Admission medications   Medication Sig Start Date End Date Taking? Authorizing Provider  acetaminophen (TYLENOL) 500 MG tablet Take 1 tablet by mouth 4 (four) times daily as needed for moderate pain or headache. 05/09/19   [provider]  atorvastatin (LIPITOR) 20 MG tablet Take 1 tablet by mouth daily. 10/13/21   [provider]  cholecalciferol (VITAMIN D)  1000 units tablet Take 1,000 Units by mouth daily.    [provider]  DULoxetine (CYMBALTA) 30 MG capsule Take 30 mg by mouth every evening.    [provider]  levothyroxine (SYNTHROID, LEVOTHROID) 25 MCG tablet Take 25 mcg by mouth daily before breakfast.    [provider]  metFORMIN (GLUCOPHAGE) 1000 MG tablet Take 500 mg by mouth 2 (two) times daily with a meal.     [provider]  OXcarbazepine (TRILEPTAL) 150 MG tablet Take 150 mg by mouth 2 (two) times daily.    [provider]  pregabalin (LYRICA) 150 MG capsule Take 150 mg by mouth 2 (two) times daily. 09/21/19   [provider]  senna-docusate (SENOKOT-S) 8.6-50 MG tablet Take 1 tablet by mouth daily. 08/24/19   [provider]  traZODone (DESYREL) 100 MG tablet Take 100 mg by mouth at bedtime. 08/24/19   [provider]    Family History Family History  Problem Relation Age of Onset   Bipolar disorder Mother    Alcohol abuse Father     Social History Social History   Tobacco Use   Smoking status: Former   Smokeless tobacco: Former  Substance Use Topics   Alcohol use: No   Drug use: No     Allergies   Ondansetron, Ondansetron hcl, and Lamictal [lamotrigine]   Review of Systems Review of Systems  Constitutional:  Positive for fatigue.  HENT:  Positive for congestion and sore throat.   Respiratory:  Positive for cough. Negative for shortness of breath and wheezing.   Gastrointestinal: Negative.   Genitourinary: Negative.   Musculoskeletal: Negative.   Psychiatric/Behavioral: Negative.       Physical Exam Triage Vital Signs ED Triage Vitals  Encounter Vitals Group     BP 02/13/24 0924 130/68     Systolic BP Percentile --      Diastolic BP Percentile --      Pulse Rate 02/13/24 0924 92     Resp 02/13/24 0924 14     Temp 02/13/24 0924 98.8 F (37.1 C)     Temp Source 02/13/24 0924 Oral     SpO2 02/13/24 0924 97 %     Weight --       Height --      Head Circumference --      Peak Flow --      Pain Score 02/13/24 0923 2     Pain Loc --      Pain Education --      Exclude from Growth Chart --    No data found.  Updated Vital Signs BP 130/68 (BP Location: Right Arm)   Pulse 92   Temp 98.8 F (37.1 C) (Oral)   Resp 14   SpO2 97%   Visual Acuity Right Eye Distance:   Left Eye Distance:   Bilateral Distance:    Right Eye Near:   Left Eye Near:    Bilateral Near:     Physical Exam Constitutional:      General: She is not in acute distress.    Appearance: She is well-developed and normal weight. She is not ill-appearing or toxic-appearing.  HENT:     Nose: Congestion and rhinorrhea present.     Mouth/Throat:     Mouth: Mucous membranes are moist.     Pharynx: Posterior oropharyngeal erythema present. No pharyngeal swelling or oropharyngeal exudate.     Tonsils: No tonsillar exudate.  Cardiovascular:     Rate and Rhythm: Normal rate and regular rhythm.     Heart sounds: Normal heart sounds.  Pulmonary:     Effort: Pulmonary effort is normal.     Breath sounds: No wheezing or rhonchi.  Musculoskeletal:     Cervical back: Normal range of motion and neck supple.  Skin:    General: Skin is warm.  Neurological:     General: No focal deficit present.     Mental Status: She is alert.  Psychiatric:        Mood and Affect: Mood normal.      UC Treatments / Results  Labs (all labs ordered are listed, but only abnormal results are displayed) Labs Reviewed - No data to display  EKG   Radiology No results found.  Procedures Procedures (including critical care time)  Medications Ordered in UC Medications - No data to display  Initial Impression / Assessment and Plan / UC Course  I have reviewed the triage vital signs and the nursing notes.  Pertinent labs & imaging results that were available during my care of the patient were reviewed by me and considered in my medical decision making (see  chart for details).   Final Clinical Impressions(s) / UC Diagnoses   Final diagnoses:  Viral upper respiratory illness     Discharge Instructions      You were diagnosed with a viral upper respiratory infection.  You flu  and covid were negative today.  I have sent out a steroid pack and inhaler to help with your symptoms.  If you are not improving or worsening despite medications then please return for re-evaluation.     ED Prescriptions     Medication Sig Dispense Auth. Provider   methylPREDNISolone (MEDROL DOSEPAK) 4 MG TBPK tablet Take as directed 1 each Annabel Gibeau, MD   albuterol (VENTOLIN HFA) 108 (90 Base) MCG/ACT inhaler Inhale 2 puffs into the lungs every 4 (four) hours as needed for wheezing or shortness of breath. 1 each Jannifer Franklin, MD      PDMP not reviewed this encounter.   Jannifer Franklin, MD 02/13/24 1011

## 2024-02-13 NOTE — ED Triage Notes (Signed)
 Patient c/o sore throat and cough x Friday. No known fevers.

## 2024-02-13 NOTE — Discharge Instructions (Signed)
 You were diagnosed with a viral upper respiratory infection.  You flu and covid were negative today.  I have sent out a steroid pack and inhaler to help with your symptoms.  If you are not improving or worsening despite medications then please return for re-evaluation.

## 2024-02-18 ENCOUNTER — Ambulatory Visit
Admission: EM | Admit: 2024-02-18 | Discharge: 2024-02-18 | Disposition: A | Discharge status: Home / Self Care | Attending: Nurse Practitioner | Admitting: Nurse Practitioner

## 2024-02-18 ENCOUNTER — Ambulatory Visit: Admission: EM | Admit: 2024-02-18

## 2024-02-18 VITALS — BP 140/56 | HR 98 | Temp 98.8°F | Resp 15

## 2024-02-18 DIAGNOSIS — J069 Acute upper respiratory infection, unspecified: Secondary | ICD-10-CM | POA: Diagnosis not present

## 2024-02-18 MED ORDER — DEXAMETHASONE SODIUM PHOSPHATE 10 MG/ML IJ SOLN
10.0000 mg | Freq: Once | INTRAMUSCULAR | Status: AC
  Administered 2024-02-18: 10 mg via INTRAMUSCULAR

## 2024-02-18 MED ORDER — MUCINEX DM MAXIMUM STRENGTH 60-1200 MG PO TB12: 1.0000 | ORAL_TABLET | Freq: Two times a day (BID) | ORAL | 0 refills | Status: AC

## 2024-02-18 MED ORDER — FLUTICASONE PROPIONATE 50 MCG/ACT NA SUSP: 2.0000 | Freq: Every day | NASAL | 0 refills | Status: AC

## 2024-02-18 MED ORDER — AZITHROMYCIN 500 MG PO TABS: 500.0000 mg | ORAL_TABLET | Freq: Every day | ORAL | 0 refills | Status: AC

## 2024-02-18 MED ORDER — PSEUDOEPH-BROMPHEN-DM 30-2-10 MG/5ML PO SYRP: 10.0000 mL | ORAL_SOLUTION | Freq: Four times a day (QID) | ORAL | 0 refills | Status: AC | PRN

## 2024-02-18 NOTE — ED Triage Notes (Signed)
 Pt presents with c/o persistent cough and bilateral ear fullness. Pt has taken meds as prescribed and has not felt better. Has added on allergy medicine. Pt states she only sleeps

## 2024-02-18 NOTE — ED Provider Notes (Signed)
 [facility]    CSN: [id number] Arrival date & time: 02/18/24  1233      History   Chief Complaint Chief Complaint  Patient presents with   Cough    Entered by patient    HPI Gwendolyn Green is a 58 y.o. female.   Subjective:   Gwendolyn Green is a 58 year old female who presents for evaluation of ongoing upper respiratory symptoms. She reports runny nose, postnasal drainage, cough, bilateral ear congestion, headache, and diarrhea. She denies shortness of breath but notes mild wheezing when lying down. Her appetite is decreased, but she is staying well-hydrated. She denies fever, chills, body aches, sneezing, sore throat, nausea, or vomiting. The patient was previously seen on 02/13/2024 with similar symptoms, which had been present for approximately four days at that time. Flu and COVID testing were negative, and she was diagnosed with an acute viral respiratory infection. She was prescribed an albuterol inhaler and a Medrol Dosepak, and has also been taking over-the-counter Zyrtec, Benadryl, and Delsym. She continues to take the steroids as prescribed and uses her inhaler intermittently. Despite treatment, her symptoms have now persisted for a total of eight days. While not worsening, they have shown no significant improvement.  The following portions of the patient's history were reviewed and updated as appropriate: allergies, current medications, past family history, past medical history, past social history, past surgical history, and problem list.        Past Medical History:  Diagnosis Date   Anxiety    Bipolar disorder (HCC)    Depressed    Fibromyalgia    GERD (gastroesophageal reflux disease)    IBS (irritable bowel syndrome)    PTSD (post-traumatic stress disorder)     Patient Active Problem List   Diagnosis Date Noted   Hypotension 02/15/2018   Near syncope 02/15/2018   Neck pain 02/15/2018   Hypokalemia 02/15/2018   Hyponatremia  02/15/2018   SIRS (systemic inflammatory response syndrome) (HCC) 02/15/2018   Metabolic acidosis    Chest pain 12/14/2014   Diabetes mellitus without complication (HCC) 12/14/2014   Nausea & vomiting 11/24/2011   Tachycardia 11/24/2011   Leukocytosis 11/24/2011   Headache 11/24/2011   Depression 11/24/2011   Hyperlipidemia 11/24/2011   Anxiety 11/24/2011   Fibromyalgia 11/24/2011   Insomnia 11/24/2011    Past Surgical History:  Procedure Laterality Date   CHOLECYSTECTOMY      OB History   No obstetric history on file.      Home Medications    Prior to Admission medications   Medication Sig Start Date End Date Taking? Authorizing Provider  azithromycin (ZITHROMAX) 500 MG tablet Take 1 tablet (500 mg total) by mouth daily for 5 days. 02/18/24 02/23/24 Yes Lurline Idol, FNP  brompheniramine-pseudoephedrine-DM 30-2-10 MG/5ML syrup Take 10 mLs by mouth every 6 (six) hours as needed (cough and congestion). 02/18/24  Yes Markus Casten, Lelon Mast, FNP  Dextromethorphan-guaiFENesin (MUCINEX DM MAXIMUM STRENGTH) 60-1200 MG TB12 Take 1 tablet by mouth 2 (two) times daily. 02/18/24  Yes Lurline Idol, FNP  fluticasone (FLONASE) 50 MCG/ACT nasal spray Place 2 sprays into both nostrils daily. 02/18/24  Yes Lurline Idol, FNP  acetaminophen (TYLENOL) 500 MG tablet Take 1 tablet by mouth 4 (four) times daily as needed for moderate pain or headache. 05/09/19   [provider]  albuterol (VENTOLIN HFA) 108 (90 Base) MCG/ACT inhaler Inhale 2 puffs into the lungs every 4 (four) hours as needed for wheezing or shortness of breath. 02/13/24   Piontek, Denny Peon,  MD  atorvastatin (LIPITOR) 20 MG tablet Take 1 tablet by mouth daily. 10/13/21   [provider]  cholecalciferol (VITAMIN D) 1000 units tablet Take 1,000 Units by mouth daily.    [provider]  DULoxetine (CYMBALTA) 30 MG capsule Take 30 mg by mouth every evening.    [provider]  levothyroxine (SYNTHROID,  LEVOTHROID) 25 MCG tablet Take 25 mcg by mouth daily before breakfast.    [provider]  metFORMIN (GLUCOPHAGE) 1000 MG tablet Take 500 mg by mouth 2 (two) times daily with a meal.     [provider]  methylPREDNISolone (MEDROL DOSEPAK) 4 MG TBPK tablet Take as directed 02/13/24   Jannifer Franklin, MD  OXcarbazepine (TRILEPTAL) 150 MG tablet Take 150 mg by mouth 2 (two) times daily.    [provider]  pregabalin (LYRICA) 150 MG capsule Take 150 mg by mouth 2 (two) times daily. 09/21/19   [provider]  senna-docusate (SENOKOT-S) 8.6-50 MG tablet Take 1 tablet by mouth daily. 08/24/19   [provider]  traZODone (DESYREL) 100 MG tablet Take 100 mg by mouth at bedtime. 08/24/19   [provider]    Family History Family History  Problem Relation Age of Onset   Bipolar disorder Mother    Alcohol abuse Father     Social History Social History   Tobacco Use   Smoking status: Former   Smokeless tobacco: Former  Substance Use Topics   Alcohol use: No   Drug use: No     Allergies   Ondansetron, Ondansetron hcl, and Lamictal [lamotrigine]   Review of Systems Review of Systems  Constitutional:  Positive for activity change, appetite change, diaphoresis and fatigue. Negative for chills and fever.  HENT:  Positive for congestion, ear pain, postnasal drip and rhinorrhea. Negative for sneezing and sore throat.   Respiratory:  Positive for cough and wheezing. Negative for shortness of breath.   Gastrointestinal:  Positive for diarrhea. Negative for nausea and vomiting.  Neurological:  Positive for headaches.  All other systems reviewed and are negative.    Physical Exam Triage Vital Signs ED Triage Vitals  Encounter Vitals Group     BP 02/18/24 1302 (!) 140/56     Systolic BP Percentile --      Diastolic BP Percentile --      Pulse Rate 02/18/24 1301 98     Resp 02/18/24 1301 15     Temp 02/18/24 1302 98.8 F (37.1 C)      Temp Source 02/18/24 1302 Oral     SpO2 02/18/24 1301 97 %     Weight --      Height --      Head Circumference --      Peak Flow --      Pain Score 02/18/24 1300 0     Pain Loc --      Pain Education --      Exclude from Growth Chart --    No data found.  Updated Vital Signs BP (!) 140/56   Pulse 98   Temp 98.8 F (37.1 C) (Oral)   Resp 15   LMP  (LMP Unknown)   SpO2 97%   Visual Acuity Right Eye Distance:   Left Eye Distance:   Bilateral Distance:    Right Eye Near:   Left Eye Near:    Bilateral Near:     Physical Exam Vitals reviewed.  Constitutional:      General: She is awake. She  is not in acute distress.    Appearance: Normal appearance. She is well-developed and normal weight. She is ill-appearing and diaphoretic. She is not toxic-appearing.  HENT:     Head: Normocephalic.     Right Ear: Tympanic membrane, ear canal and external ear normal.     Left Ear: Tympanic membrane, ear canal and external ear normal.     Nose: Congestion present.     Mouth/Throat:     Mouth: Mucous membranes are moist.     Pharynx: Uvula midline. Postnasal drip present. No pharyngeal swelling, oropharyngeal exudate, posterior oropharyngeal erythema or uvula swelling.  Eyes:     Conjunctiva/sclera: Conjunctivae normal.  Cardiovascular:     Rate and Rhythm: Normal rate.  Pulmonary:     Effort: Pulmonary effort is normal. No tachypnea or respiratory distress.     Breath sounds: Normal breath sounds and air entry. No decreased air movement. No decreased breath sounds, wheezing or rhonchi.  Musculoskeletal:        General: Normal range of motion.     Cervical back: Full passive range of motion without pain, normal range of motion and neck supple.  Lymphadenopathy:     Cervical: Cervical adenopathy present.  Skin:    General: Skin is warm.  Neurological:     General: No focal deficit present.     Mental Status: She is alert and oriented to person, place, and time.  Psychiatric:         Behavior: Behavior is cooperative.      UC Treatments / Results  Labs (all labs ordered are listed, but only abnormal results are displayed) Labs Reviewed - No data to display  EKG   Radiology No results found.  Procedures Procedures (including critical care time)  Medications Ordered in UC Medications  dexamethasone (DECADRON) injection 10 mg (10 mg Intramuscular Given 02/18/24 1413)    Initial Impression / Assessment and Plan / UC Course  I have reviewed the triage vital signs and the nursing notes.  Pertinent labs & imaging results that were available during my care of the patient were reviewed by me and considered in my medical decision making (see chart for details).    58 year old female presenting with ongoing upper respiratory symptoms. She was last evaluated on 02/13/2024 and diagnosed with a viral respiratory infection. At that time, she was prescribed an albuterol inhaler and a Medrol Dosepak. She has also been self-medicating with over-the-counter Zyrtec, Benadryl, and Delsym. Despite this regimen, her symptoms have now persisted for a total of eight days. Although not worsening, she reports no significant improvement. She is afebrile and appears acutely ill but remains in no acute distress. Physical examination is documented above and reveals no concerning findings.  At this visit, the patient was advised to continue the Medrol Dosepak and albuterol inhaler as previously prescribed. Delsym was discontinued, and Bromfed-DM was prescribed for symptomatic relief. She was instructed to continue cetirizine but to discontinue Benadryl to avoid excessive antihistamine use. Given the persistence of symptoms, azithromycin was started empirically. A dexamethasone injection was administered in the clinic to help with inflammation and symptom relief. Supportive care measures were reviewed, and return precautions were discussed. She was advised to follow up as needed for worsening  or unresolved symptoms.  Today's evaluation has revealed no signs of a dangerous process. Discussed diagnosis with patient and/or guardian. Patient and/or guardian aware of their diagnosis, possible red flag symptoms to watch out for and need for close follow up. Patient and/or guardian understands  verbal and written discharge instructions. Patient and/or guardian comfortable with plan and disposition.  Patient and/or guardian has a clear mental status at this time, good insight into illness (after discussion and teaching) and has clear judgment to make decisions regarding their care  Documentation was completed with the aid of voice recognition software. Transcription may contain typographical errors. Final Clinical Impressions(s) / UC Diagnoses   Final diagnoses:  Upper respiratory tract infection, unspecified type     Discharge Instructions      You have an upper respiratory infection, and I'm making a few changes to your treatment plan from 02/13/2024 to help you feel better.  Please stop taking Delsym and start the prescribed medication called Bromfed DM. You can take it every 6 hours as needed, but I recommend taking it regularly for the next couple of days until your symptoms begin to improve. You should continue using the steroid medication and inhaler as previously prescribed. Stop taking Benadryl, but keep taking your daily cetirizine.  I've also started you on an antibiotic called azithromycin. Be sure to finish the entire course, even if you start feeling better before it's done.   For nasal congestion and ear pressure, use the prescribed Flonase nasal spray. To use it correctly, gently blow your nose first to clear your nostrils. Shake the bottle and remove the cap. Close one nostril with your finger, and insert the spray tip into the other nostril, aiming slightly outward (away from the center of your nose). As you gently breathe in through your nose, press down on the spray to  release one dose. Do not sniff too hard. If you can taste the medicine or feel it in your throat, you're using it incorrectly.  You may take Tylenol or ibuprofen as needed for pain or fever. Be sure to drink plenty of fluids--enough to keep your urine pale yellow. Staying hydrated helps thin mucus, making it easier to clear. Using a cool mist humidifier at home can help maintain a humidity level above 50%, which is good for your airways. If you don't have a humidifier, try inhaling steam for 10 to 15 minutes, 3 to 4 times a day. You can do this by sitting in the bathroom with a hot shower running or by using over-the-counter vapor shower tablets, which are helpful for nasal congestion.  Try to limit your time in cool or dry air, and sleep with your head slightly elevated to reduce post-nasal drainage. Getting enough rest each night is also important for recovery. Lastly, don't forget to change your toothbrush after you finish your antibiotics     ED Prescriptions     Medication Sig Dispense Auth. Provider   brompheniramine-pseudoephedrine-DM 30-2-10 MG/5ML syrup Take 10 mLs by mouth every 6 (six) hours as needed (cough and congestion). 120 mL Gwendolyn Green, [place], FNP   Dextromethorphan-guaiFENesin (MUCINEX DM MAXIMUM STRENGTH) 60-1200 MG TB12 Take 1 tablet by mouth 2 (two) times daily. 20 tablet Lurline Idol, FNP   azithromycin (ZITHROMAX) 500 MG tablet Take 1 tablet (500 mg total) by mouth daily for 5 days. 5 tablet Lurline Idol, FNP   fluticasone (FLONASE) 50 MCG/ACT nasal spray Place 2 sprays into both nostrils daily. 9.9 mL Lurline Idol, FNP      PDMP not reviewed this encounter.   Cathlean Sauer [place], [place] 02/19/24 [phone]

## 2024-02-18 NOTE — Discharge Instructions (Addendum)
 You have an upper respiratory infection, and I'm making a few changes to your treatment plan from 02/13/2024 to help you feel better.  Please stop taking Delsym and start the prescribed medication called Bromfed DM. You can take it every 6 hours as needed, but I recommend taking it regularly for the next couple of days until your symptoms begin to improve. You should continue using the steroid medication and inhaler as previously prescribed. Stop taking Benadryl, but keep taking your daily cetirizine.  I've also started you on an antibiotic called azithromycin. Be sure to finish the entire course, even if you start feeling better before it's done.   For nasal congestion and ear pressure, use the prescribed Flonase nasal spray. To use it correctly, gently blow your nose first to clear your nostrils. Shake the bottle and remove the cap. Close one nostril with your finger, and insert the spray tip into the other nostril, aiming slightly outward (away from the center of your nose). As you gently breathe in through your nose, press down on the spray to release one dose. Do not sniff too hard. If you can taste the medicine or feel it in your throat, you're using it incorrectly.  You may take Tylenol or ibuprofen as needed for pain or fever. Be sure to drink plenty of fluids--enough to keep your urine pale yellow. Staying hydrated helps thin mucus, making it easier to clear. Using a cool mist humidifier at home can help maintain a humidity level above 50%, which is good for your airways. If you don't have a humidifier, try inhaling steam for 10 to 15 minutes, 3 to 4 times a day. You can do this by sitting in the bathroom with a hot shower running or by using over-the-counter vapor shower tablets, which are helpful for nasal congestion.  Try to limit your time in cool or dry air, and sleep with your head slightly elevated to reduce post-nasal drainage. Getting enough rest each night is also important for recovery.  Lastly, don't forget to change your toothbrush after you finish your antibiotics
